# Patient Record
Sex: Female | Born: 1955 | Race: White | Hispanic: No | Marital: Married | Smoking: Never smoker
Health system: Southern US, Community
[De-identification: ages and names within clinical notes are randomized; demographics above are authoritative.]

## PROBLEM LIST (undated history)

## (undated) DIAGNOSIS — M1990 Unspecified osteoarthritis, unspecified site: Secondary | ICD-10-CM

## (undated) DIAGNOSIS — F419 Anxiety disorder, unspecified: Secondary | ICD-10-CM

## (undated) DIAGNOSIS — M75100 Unspecified rotator cuff tear or rupture of unspecified shoulder, not specified as traumatic: Secondary | ICD-10-CM

## (undated) DIAGNOSIS — K219 Gastro-esophageal reflux disease without esophagitis: Secondary | ICD-10-CM

## (undated) HISTORY — PX: KNEE ARTHROSCOPY: SUR90

## (undated) HISTORY — PX: TONSILLECTOMY: SUR1361

## (undated) HISTORY — PX: UPPER GASTROINTESTINAL ENDOSCOPY: SHX188

---

## 2001-01-14 HISTORY — PX: TOTAL ABDOMINAL HYSTERECTOMY: SHX209

## 2001-01-14 HISTORY — PX: ANTERIOR AND POSTERIOR REPAIR: SHX1172

## 2001-09-29 ENCOUNTER — Other Ambulatory Visit: Admission: RE | Admit: 2001-09-29 | Discharge: 2001-09-29 | Payer: Self-pay | Admitting: Obstetrics and Gynecology

## 2001-11-03 ENCOUNTER — Encounter (INDEPENDENT_AMBULATORY_CARE_PROVIDER_SITE_OTHER): Payer: Self-pay | Admitting: Specialist

## 2001-11-03 ENCOUNTER — Inpatient Hospital Stay (HOSPITAL_COMMUNITY): Admission: RE | Admit: 2001-11-03 | Discharge: 2001-11-06 | Payer: Self-pay | Admitting: Obstetrics and Gynecology

## 2004-05-07 ENCOUNTER — Ambulatory Visit (HOSPITAL_COMMUNITY): Admission: RE | Admit: 2004-05-07 | Discharge: 2004-05-07 | Payer: Self-pay | Admitting: Gastroenterology

## 2005-02-14 ENCOUNTER — Emergency Department (HOSPITAL_COMMUNITY): Admission: EM | Admit: 2005-02-14 | Discharge: 2005-02-14 | Payer: Self-pay | Admitting: Emergency Medicine

## 2005-04-30 ENCOUNTER — Ambulatory Visit (HOSPITAL_COMMUNITY): Admission: RE | Admit: 2005-04-30 | Discharge: 2005-04-30 | Payer: Self-pay | Admitting: Family Medicine

## 2007-01-15 HISTORY — PX: ORIF ANKLE FRACTURE: SUR919

## 2007-01-29 ENCOUNTER — Ambulatory Visit (HOSPITAL_COMMUNITY): Admission: RE | Admit: 2007-01-29 | Discharge: 2007-01-29 | Payer: Self-pay | Admitting: Obstetrics & Gynecology

## 2007-08-19 ENCOUNTER — Ambulatory Visit (HOSPITAL_COMMUNITY): Admission: RE | Admit: 2007-08-19 | Discharge: 2007-08-19 | Payer: Self-pay | Admitting: Gastroenterology

## 2007-11-11 ENCOUNTER — Emergency Department (HOSPITAL_COMMUNITY): Admission: EM | Admit: 2007-11-11 | Discharge: 2007-11-11 | Payer: Self-pay | Admitting: Emergency Medicine

## 2007-11-12 ENCOUNTER — Ambulatory Visit (HOSPITAL_BASED_OUTPATIENT_CLINIC_OR_DEPARTMENT_OTHER): Admission: RE | Admit: 2007-11-12 | Discharge: 2007-11-12 | Payer: Self-pay | Admitting: Orthopaedic Surgery

## 2008-01-15 HISTORY — PX: HARDWARE REMOVAL: SHX979

## 2008-08-17 ENCOUNTER — Emergency Department (HOSPITAL_COMMUNITY): Admission: EM | Admit: 2008-08-17 | Discharge: 2008-08-17 | Payer: Self-pay | Admitting: Emergency Medicine

## 2008-09-27 ENCOUNTER — Ambulatory Visit (HOSPITAL_BASED_OUTPATIENT_CLINIC_OR_DEPARTMENT_OTHER): Admission: RE | Admit: 2008-09-27 | Discharge: 2008-09-27 | Payer: Self-pay | Admitting: Orthopaedic Surgery

## 2009-02-27 ENCOUNTER — Ambulatory Visit (HOSPITAL_COMMUNITY): Admission: RE | Admit: 2009-02-27 | Discharge: 2009-02-27 | Payer: Self-pay | Admitting: Gastroenterology

## 2009-04-03 ENCOUNTER — Encounter: Admission: RE | Admit: 2009-04-03 | Discharge: 2009-04-03 | Payer: Self-pay | Admitting: Internal Medicine

## 2010-01-14 HISTORY — PX: ERCP: SHX60

## 2010-02-04 ENCOUNTER — Encounter: Payer: Self-pay | Admitting: Gastroenterology

## 2010-03-08 ENCOUNTER — Ambulatory Visit (HOSPITAL_COMMUNITY)
Admission: RE | Admit: 2010-03-08 | Discharge: 2010-03-08 | Disposition: A | Discharge status: Home / Self Care | Payer: 59 | Source: Ambulatory Visit | Attending: Gastroenterology | Admitting: Gastroenterology

## 2010-03-08 DIAGNOSIS — K222 Esophageal obstruction: Secondary | ICD-10-CM | POA: Insufficient documentation

## 2010-03-08 DIAGNOSIS — Z1211 Encounter for screening for malignant neoplasm of colon: Secondary | ICD-10-CM | POA: Insufficient documentation

## 2010-04-17 NOTE — Op Note (Signed)
  [name], Lacey               ACCOUNT NO.:  [id number]  MEDICAL RECORD NO.:  [id number]           PATIENT TYPE:  O  LOCATION:  WLEN                         FACILITY:  [facility]  PHYSICIAN:  Graylin Shiver, M.D.   DATE OF BIRTH:  1955-12-10  DATE OF PROCEDURE:  03/08/2010 DATE OF DISCHARGE:                              OPERATIVE REPORT   PROCEDURE:  Screening colonoscopy.  INDICATIONS FOR PROCEDURE:  Screening for colorectal cancer.  Informed consent was obtained after explanation of the risks of bleeding, infection, and perforation.  PREMEDICATION:  The procedure was done after an EGD with balloon dilatation.  There was an additional dose of fentanyl 75 mg IV given and Versed 3 mg given for this procedure.  DESCRIPTION OF PROCEDURE:  With the patient in the left lateral decubitus position, a rectal exam was performed.  No masses were felt. The Pentax colonoscope was inserted into the rectum and advanced around the colon to the cecum.  Cecal landmarks were identified.  The cecum and ascending colon were normal.  The transverse colon was normal.  The descending colon, sigmoid, and rectum were normal.  Retroflexion was normal.  She tolerated the procedure well without complications.  IMPRESSION:  Normal colonoscopy to the cecum.  Would recommend repeat screening colonoscopy in 10 years.          ______________________________ Graylin Shiver, M.D.     SFG/MEDQ  D:  03/08/2010  T:  03/09/2010  Job:  [id number]  Electronically Signed by Herbert Moors MD on 04/17/2010 04:38:12 PM

## 2010-04-17 NOTE — Op Note (Signed)
  Lacey [name], Lacey [name]               ACCOUNT NO.:  [id number]  MEDICAL RECORD NO.:  [id number]           PATIENT TYPE:  O  LOCATION:  WLEN                         FACILITY:  [facility]  PHYSICIAN:  Graylin Shiver, M.D.   DATE OF BIRTH:  02-26-1955  DATE OF PROCEDURE:  03/08/2010 DATE OF DISCHARGE:                              OPERATIVE REPORT   PROCEDURE:  Esophagogastroduodenoscopy with endoscopic balloon dilatation of the Schatzki ring.  INDICATIONS FOR PROCEDURE:  The patient is a 55 year old female who has had two meat impactions removed from her esophagus in the past, the first one done by Dr. Bosie Clos and the second one done by Dr. Madilyn Fireman. These occurred about a year and a half apart.  It was determined on both of those endoscopies that she had a distal esophageal ring and it was recommended to her to have this dilated.  She had never gotten around to doing that, but decided to have it done now because she was continuing to have problems with dysphagia.  Informed consent was obtained after explanation of the risks of bleeding, infection, and perforation.  PREMEDICATION:  Fentanyl 75 mcg IV, Versed 6 mg IV, and Benadryl 50 mg IV.  DESCRIPTION OF PROCEDURE:  With the patient in the left lateral decubitus position, a Pentax gastroscope was inserted into the oropharynx and passed into the esophagus.  It was advanced down the esophagus, then into the stomach, and into the duodenum.  The second portion and bulb of the duodenum were normal.  The stomach had normal- appearing mucosa in its entirety.  Retroflexion of the fundus and cardia looked normal.  The scope was then brought back to the esophagogastric junction and it seemed to be a little tight although I did not definitely appreciate a concentric ring, which had been seen on previous occasions.  In any event, because of the prior history and findings, and because of the two episodes of meat impaction, I went ahead and  advanced an endoscopic balloon dilator and placed it at the level of the esophagogastric junction.  The balloon was inflated to 15, then 16.5, then 18 mm, and held in place at each level for 1 minute.  I then reexamined the area and then re-dilated the area once again to 16.5 and 18 mm.  There was a little heme at the site of the dilation.  No other abnormalities were seen.  She tolerated the procedure well without complications.  IMPRESSION:  Schatzki' ring, which was not definitely appreciated on this endoscopic examination, but had been in the past.  This was dilated to 18 mm.  PLAN:  We will observe the response to the dilatation.          ______________________________ Graylin Shiver, M.D.    SFG/MEDQ  D:  03/08/2010  T:  03/09/2010  Job:  [id number]  Electronically Signed by Herbert Moors MD on 04/17/2010 04:38:09 PM

## 2010-04-20 LAB — POCT HEMOGLOBIN-HEMACUE: Hemoglobin: 13.2 g/dL (ref 12.0–15.0)

## 2010-05-29 NOTE — Op Note (Signed)
NAMECOURTENEY, ALDERETE               ACCOUNT NO.:  [id number]   MEDICAL RECORD NO.:  [id number]          PATIENT TYPE:  AMB   LOCATION:  ENDO                         FACILITY:  [facility]   PHYSICIAN:  Shirley Friar, MDDATE OF BIRTH:  Nov 22, 1955   DATE OF PROCEDURE:  DATE OF DISCHARGE:                               OPERATIVE REPORT   INDICATIONS:  Dysphagia.   HISTORY OF PRESENT ILLNESS:  Ms. Cammack is a 55 year old white female  who was an established patient of Dr. Evette Cristal who ate a piece of chicken  yesterday and felt like it hung up in her esophagus when she swallowed.  She started retching and felt like she threw some of it up yesterday.  Since that time, she has been unable to swallow any saliva and called to  have this further evaluated.  She was recommended to have upper  endoscopy with possible dilation in 2008, but she never followed through  on schedule.   MEDICATIONS:  1. Fentanyl 100 mcg IV.  2. Versed 9 mg IV.   FINDINGS:  Endoscope was inserted through the oropharynx and esophagus  was intubated.  The proximal and mid esophagus was normal in appearance  except for some pools of saliva seen.  In the distal esophagus, there  was a large food bolus that was obscuring the lumen of the esophagus.  During attempts to remove this food bolus with Lucina Mellow Net, the food bolus  advanced down into the stomach and the esophagus was then freed of all  food particles.  On careful evaluation of the esophagus, a distal  nonobstructing circumferential benign-appearing esophageal ring was  seen.  This esophageal ring was noted at the GE junction.  There was  some mild erythema of this area consistent with pressure irritation from  the food bolus.  Endoscope was easily advanced through the esophageal  ring into the stomach which revealed a normal stomach mucosa.  Retroflexion confirmed normal proximal stomach.  Endoscope was  straightened and advanced into the duodenal bulb and second  portion of  the duodenum which were both normal.  The endoscope was then withdrawn  back into the esophagus and the distal esophageal ring was reevaluated.  Due to trauma from the food bolus causing erythema and edema, decision  was made not to dilate at this time.   ASSESSMENT:  1. Distal nonobstructing esophageal ring (Schatzki's ring).  2. Food bolus cleared during endoscopy.   PLAN:  1. The patient needs to resume Prilosec 20 mg p.o. daily.  2. The patient is to have upper endoscopy in 3-4 weeks for esophageal      dilation.      Shirley Friar, MD  Electronically Signed     VCS/MEDQ  D:  08/19/2007  T:  08/19/2007  Job:  [id number]   cc:   Graylin Shiver, M.D.  Sigmund Hazel, M.D.
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
## 2010-06-01 NOTE — Discharge Summary (Signed)
NAME:  Lacey [name], Lacey [name]                         ACCOUNT NO.:  [id number]   MEDICAL RECORD NO.:  [id number]                   PATIENT TYPE:  INP   LOCATION:  0471                                 FACILITY:  [facility]   PHYSICIAN:  Carrington Clamp, M.D.              DATE OF BIRTH:  Aug 28, 1955   DATE OF ADMISSION:  11/03/2001  DATE OF DISCHARGE:  11/06/2001                                 DISCHARGE SUMMARY   ADMISSION DIAGNOSES:  1. Right ovarian complex cyst.  2. Menorrhagia.  3. Pelvic organ prolapse with cystocele and rectocele.  4. Stress urinary incontinence.   DISCHARGE DIAGNOSES:  1. Right ovarian complex cyst.  2. Menorrhagia.  3. Pelvic organ prolapse with cystocele and rectocele.  4. Stress urinary incontinence.   PROCEDURE:  1. Total abdominal hysterectomy.  2. Right salpingo-oophorectomy.  3. Charletta Cousin procedure.  4. Cystoscopy.  5. Posterior repair with culdoplasty.   LABORATORY DATA:  Postoperative hemoglobin and hematocrit 9 and 26.6.   PHYSICAL EXAMINATION:  Please refer to the dictated history and physical.   HISTORY OF PRESENT ILLNESS:  Briefly, this is a 55 year old Gravida II, Para  II 0/0/2, complaining of pain and pressure, now with a 3-4 cm cyst on her  ovary and having stress urinary incontinence and symptoms of pelvic organ  prolapse.   HOSPITAL COURSE:  The patient was admitted on October 21. 2003 for the above  named procedures. She underwent them without complications. On November 04, 2001, postoperative day one, the patient was having difficulty with pain  control. On October 23. 2003 her hemoglobin appeared to drop to 7.9 with  hematocrit 23.4. However, repeat hemoglobin and hematocrit was 9 and 26.6.  She also had some significant bruising on her abdomen that was slowly  evolving. However, there was no evidence of bleeding on pelvic examination  either. The patient on November 05, 2001 underwent a voiding trial. However,  she was unable to void and  so the catheter was placed back in overnight. On  November 06, 2001 she underwent a voiding trial again but failed it and went  home with the following.   DISCHARGE MEDICATIONS:  1. Cefotan 250 mg twice a day times three days.  2. Percocet 5 mg one by mouth every three hour as needed pain.  3. Motrin as needed.  4. Colace 100 mg one by mouth twice a day times six weeks.   SPECIAL INSTRUCTIONS:  The patient was discharged with Foley and leg bag.   ACTIVITY:  No straining times six weeks, no heavy lifting times six weeks,  pelvic rest times six weeks and no driving times two weeks.   DIET:  High fiber diet.   FOLLOW UP:  The following Monday for voiding trial in the office.  Carrington Clamp, M.D.    MH/MEDQ  D:  12/08/2001  T:  12/08/2001  Job:  [id number]

## 2010-06-01 NOTE — Op Note (Signed)
NAME:  Lacey [name], Lacey [name]                         ACCOUNT NO.:  [id number]   MEDICAL RECORD NO.:  [id number]                   PATIENT TYPE:  INP   LOCATION:  0002                                 FACILITY:  [facility]   PHYSICIAN:  Carrington Clamp, M.D.              DATE OF BIRTH:  22-Mar-1955   DATE OF PROCEDURE:  11/03/2001  DATE OF DISCHARGE:                                 OPERATIVE REPORT   PREOPERATIVE DIAGNOSES:  1. Complex right ovarian cyst.  2. Menorrhagia.  3. Pelvic organ prolapse with cystocele and rectocele.  4. Stress urinary incontinence.   POSTOPERATIVE DIAGNOSES:  1. Complex right ovarian cyst.  2. Menorrhagia.  3. Pelvic organ prolapse with cystocele and rectocele.  4. Stress urinary incontinence.   PROCEDURES:  1. Total abdominal hysterectomy.  2. Right salpingo-oophorectomy.  3. Burch procedure.  4. Cystoscopy.  5. Posterior repair.  6. Culdoplasty.   SURGEON:  Carrington Clamp, M.D.   ASSISTANT:  Luvenia Redden, M.D.   ANESTHESIA:  General endotracheal anesthesia.   ESTIMATED BLOOD LOSS:  250 cc.   INTRAVENOUS FLUIDS:  3600 cc.   URINE OUTPUT:  400 cc.   COMPLICATIONS:  None.   FINDINGS:  Right ovary with approximately 3 cm cyst but, otherwise, normal  in appearance.  The left ovary was normal with some adhesions to the bowel.  The uterus had multiple small fibroids but, otherwise, was approximately  seven weeks in size.  The cystocele was successfully reduced with the Burch  and; therefore, an anterior repair was not needed to be done.  Rectocele was  to the 0 position of the hymenal ring.  There was good efflux of indigo  carmine from the bilateral ureteral orifices off and on tension.  There were  no stitches in the bladder.   MEDICATIONS:  Indigo carmine.   PATHOLOGY:  Right ovary which was sent to pathology during the case.  The  pathologist called and indicated that it was benign in general appearance  and a frozen section was not done.  The uterus, cervix and right tube were  also sent to pathology.   COUNTS:  Correct x3.   DESCRIPTION OF PROCEDURE:  After adequate general anesthesia was achieved,  the patient was prepped and draped in the usual sterile fashion in the  dorsal lithotomy position.  The bladder was catheterized with the Foley  catheter and a Pfannenstiel skin incision was then made in the abdomen  approximately 1 cm above the pubic symphysis.  This was carried down to the  fascia with the Bovie cautery, and then the fascia was incised in the  midline with the scalpel and carried in a transverse curvilinear mode with  Mayo scissors.   The fascia was reflected superiorly and inferiorly from the rectus muscles  and the rectus muscles split in the midline.  The free portion of the  peritoneum was then dissected carefully with Metzenbaum  scissors and then  incised in a superior and inferior manner with good visualization of the  bowel and the bladder.   The Balfour retractor was placed and the bowel was packed away with four wet  laps.  A small amount of adhesions of the bowel to the left ovary were taken  down with Metzenbaum scissors.  A pair of Kellys were used to grasp the  uterus at the cornua.   Attention was then turned the ovary.  On the right-hand side, the round  ligament was secured with a suture of transfixion stitch of #0 Vicryl and  divided with the Bovie cautery.  The Bovie was then used to incise the  vesicouterine fascia and also the posterior leaf of the broad ligament in  order to enter into it.  The infundibulopelvic ligament on the right-hand  side was secured with a Haney clamp and then divided with the Mayo scissors.  The pedicle was then secured with a free hand tie of a stitch of #0 Vicryl.  The uterine arteries were skeletonized on the right-hand side and the  bladder taken down with sharp and blunt dissection.   Attention was then turned to the left-hand side where the round  ligament was  secured in the same manner.  The posterior peritoneum was entered into with  the Bovie cautery, and the ovarian ligament was secured with a Haney stitch.  The ovary was left in on this side per the patient's request.  The pedicle  was secured with a free hand tie of #0 Vicryl followed by a stitch of #0  Vicryl.   The uterine arteries were skeletonized on the left-hand side and Haneys were  placed at the level of the internal os of the cervix.  Each pedicle was  divided with Mayo scissors and secured with a stitch of #0 Vicryl.  The  cardinal ligament was then divided in alternating successive bites with the  [facility] clamp.  Each pedicle was divided with the scalpel and secured with  a stitch of #0 Vicryl.  At the level of the uterosacrals and the reflection  of the vagina onto the cervix, a pair of Haney were placed.  The Mayo  scissors were used to incise each pedicle and each pedicle was secured with  a Haney stitch of #0 Vicryl.  The uterine and cervix specimen were then  amputated with the Jorgenson scissors and handed to pathology.   The cuff was closed with multiple figure-of-eight stitches, the angles of  which being attached to the uterosacrals for support.  Hemostasis was  achieved, and the pelvis was irrigated with sterile water.   Because of the depth of the cul-de-sac and the risks of enterocele with a  Burch procedure, a culdoplasty was undertaken with three 3-0 silk sutures  which were passed through the descending, sigmoid, peritoneum, cul-de-sac  and the posterior peritoneum of the vagina.  The lateral stitches were also  passed through the uterosacrals.  These were tied down.   All instruments were then withdrawn from the abdomen and the peritoneum was  closed with a running stitch of 2-0 Vicryl.  The Balfour retractor was replaced with the blades underneath the rectus muscles and the space of  Retzius dissected bluntly.  The back of a DeBakey was used  to clear the fat  off of the vesicovaginal fascia just inferior and lateral to the bladder  neck bilaterally.  Bilateral stitches were placed, one medial and one  lateral in  this area of #1 Prolene.  Attention was then turned to cystoscopy  which revealed good efflux of indigo carmine from bilateral ureteral  orifices both off and on tension with the stitches.  There were no stitches  located in the bladder as well.   After changing gloves, attention was then turned to the abdomen where each  of the stitches was placed through its respective Cooper's ligament.  These  were tied down medial first and then lateral stitches for the appropriate  amount of tension to provide patient continence.  The Foley had been  replaced after the cystoscopy had been performed.   The space of Retzius was irrigated and found to be hemostatic, and the  fascia was then closed with a running stitch of #0 PDS.  The subcutaneous  tissue was rendered hemostatic with Bovie cautery, and then the skin was  closed with staples.   Attention was turned to the vagina of which the posterior fourchette was  grasped with a pair of Allis clamps and an inverted triangular incision was  made on the perineum with the scalpel.  This triangular piece of skin was  removed and then the dissection of the vaginal mucosa off the rectovaginal  fascia was undertaken with sharp and blunt dissection with  the Metzenbaums  in the midline.  With the aid of multiple Allis clamps, the dissection was  carried laterally with sharp and blunt dissection with the Metzenbaum  scissors as well.  Three mattress sutures were placed in the lateral  rectovaginal fascia bilaterally and tied down to close the space.  The  rectovaginal mucosa was then trimmed and the vaginal mucosa closed with a  running locked stitch of 2-0 Vicryl in the same manner as an episiotomy.  The cystocele was adequately reduced with the Burch procedure and the cuff  was also  suspended so the operation was then terminated.   The patient tolerated the procedure well and was returned to the recovery  room in stable condition.                                               Carrington Clamp, M.D.    MH/MEDQ  D:  11/03/2001  T:  11/03/2001  Job:  [id number]

## 2010-06-01 NOTE — H&P (Signed)
NAME:  Lacey, [name]                         ACCOUNT NO.:  [id number]   MEDICAL RECORD NO.:  [id number]                   PATIENT TYPE:  INP   LOCATION:  NA                                   FACILITY:  [facility]   PHYSICIAN:  Carrington Clamp, M.D.              DATE OF BIRTH:  1955/06/08   DATE OF ADMISSION:  DATE OF DISCHARGE:                                HISTORY & PHYSICAL   CHIEF COMPLAINT:  This is a 55 year old, G2, P2-0-0-2, complaining of pain  and pressure, now with a 3-4 cm cyst on her ovary.   HISTORY OF PRESENT ILLNESS:  The patient was referred to me after having an  ultrasound at [facility] which showed a 5 cm, thin-walled cyst  with some septations and debris.  The patient had been complaining of  pressure.  She had stated her periods about a year ago had been normal and  every 28 days but had become more irregular every 24-32 days.  She had  actually skipped her period in August.  She has no intramenstrual bleeding  but some postcoital bleeding that had been investigated in 1994, with an  endometrial biopsy that had been negative.  She complained of some  discomfort with sexual intercourse.   She also complained of having to use laxatives and splinting in order to  have bowel movements because of a significant rectocele.  She has urge  symptoms and leaks but also has occasional stress urinary incontinence with  increased activity.  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   PAST MEDICAL HISTORY:  Negative for diabetes, high blood pressure, heart  disease, or thyroid problems.   PAST SURGICAL HISTORY:  In 1977, she had a pelvic infection followed by a  laparoscopy in 1979 for extensive adhesions.   PAST OBSTETRICAL HISTORY:  Term spontaneous vaginal delivery x 2.   FAMILY HISTORY:  Negative for breast or female  cancers.   SOCIAL HISTORY:  The patient is not a smoker.   MEDICATIONS:  She is on no medications currently.   ALLERGIES:  She is possibly allergic to MORPHINE.   PHYSICAL EXAMINATION:  HEENT:  Anicteric, without lymphadenopathy.  HEART:  Regular rate and rhythm.  LUNGS:  Clear to auscultation bilaterally.  BREASTS:  Without masses.  ABDOMEN:  Soft, nontender, nondistended.  EXTREMITIES:  Benign.  PELVIC:  Normal external genitalia.  Vaginal exam revealed a rectocele to  the 0 position of the hymenal ring and a cystocele to the -1 position above  the hymenal ring.  A Q-tip test showed about 90 degrees of deflection of the  urethra on Valsalva.  There was no spontaneous leak, however.  Her uterus is  approximately eight weeks in size and retroverted.  There appeared at the  top of the cuff  during rectovaginal exam a mobile 3-4 cm tender mass in the  back of the cuff on the left.  Hemoccult was negative.  The cervix did have  some descensus to it.   LABORATORY DATA:  The patient had a Pap smear that was negative.  A follow-  up ultrasound showed a small fibroid in the ventral wall of the uterus.  The  left ovary had a simple cyst measuring 3.1 x 2.2.  The right ovary was 3.8 x  4.8.  The cyst within the left ovary measured about 3 cm and was simple and  benign in appearance.  The hypoechoic process within the right ovary  measured approximately 6.4 x 2 cm in maximal dimension.  It may actually  include two separate cysts with a curvilinear septation and echogenic  material still seen within the cyst.  Although the cyst does not have any  typical cancerous appearance, it is still significant and needs to be  examined.   A CA125 was performed which was 14, which is normal.   PLAN:  All risks, benefits, and alternatives of surgery were discussed with  the patient.  She is for a total abdominal hysterectomy with right salpingo-  oophorectomy, possible BSO, depending on the status of  the left ovary.  I  elected to do an open procedure because of a history of infection in the  patient and adhesions diagnosed on the laparoscopy which would make a  vaginal hysterectomy much more difficult.  Because of the possibility that  this may be also a cancerous process, I feel the need to take the ovary out  directly rather than trying to get it out from below or via a laparoscopic-  assisted procedure.  The patient also has been consented for a Burch  procedure with anterior and posterior repair based on the risks of stress  urinary incontinence developing shortly after surgery because of her extreme  urethral hypermobility.  The patient understands that there is a risk of  retention in this procedure despite the fact that she has no occult stress  urinary incontinence at this time, probably secondary to the large  cystocele.  The patient understands and accepts these risks in order to save  herself from having to go to the operating room in the future to fix the  stress urinary incontinence and urethral hypermobility.  The patient will  receive preoperative antibiotics and sequential compression devices in the  OR.  All risks, benefits, and alternatives were discussed with the patient.  The patient understands and agrees and will undergo a TAH, RSO, possible BSO  with Burch procedure, cystoscopy, anterior and posterior repair tomorrow.                                               Carrington Clamp, M.D.    MH/MEDQ  D:  11/02/2001  T:  11/02/2001  Job:  [id number]

## 2010-09-24 ENCOUNTER — Other Ambulatory Visit (HOSPITAL_COMMUNITY): Payer: Self-pay | Admitting: Family Medicine

## 2010-09-24 DIAGNOSIS — Z1231 Encounter for screening mammogram for malignant neoplasm of breast: Secondary | ICD-10-CM

## 2010-10-03 ENCOUNTER — Ambulatory Visit (HOSPITAL_COMMUNITY): Payer: 59

## 2010-10-10 ENCOUNTER — Ambulatory Visit (HOSPITAL_COMMUNITY): Payer: 59

## 2010-10-10 ENCOUNTER — Ambulatory Visit (HOSPITAL_COMMUNITY)
Admission: RE | Admit: 2010-10-10 | Discharge: 2010-10-10 | Disposition: A | Discharge status: Home / Self Care | Payer: 59 | Source: Ambulatory Visit | Attending: Family Medicine | Admitting: Family Medicine

## 2010-10-10 DIAGNOSIS — Z1231 Encounter for screening mammogram for malignant neoplasm of breast: Secondary | ICD-10-CM | POA: Insufficient documentation

## 2010-10-15 LAB — DIFFERENTIAL
Basophils Absolute: 0.1
Basophils Relative: 1
Monocytes Absolute: 0.5
Neutro Abs: 4.6

## 2010-10-15 LAB — POCT I-STAT, CHEM 8
BUN: 18
Calcium, Ion: 1.19
Chloride: 105
Creatinine, Ser: 1
Hemoglobin: 11.9 — ABNORMAL LOW
Sodium: 141
TCO2: 26

## 2010-10-15 LAB — CBC
MCHC: 33.5
Platelets: 336
RDW: 14

## 2010-10-15 LAB — URINALYSIS, ROUTINE W REFLEX MICROSCOPIC
Bilirubin Urine: NEGATIVE
Hgb urine dipstick: NEGATIVE
Ketones, ur: NEGATIVE
Nitrite: NEGATIVE
Urobilinogen, UA: 0.2

## 2010-10-15 LAB — SAMPLE TO BLOOD BANK

## 2011-05-24 ENCOUNTER — Encounter (HOSPITAL_COMMUNITY): Payer: Self-pay | Admitting: *Deleted

## 2011-05-24 ENCOUNTER — Emergency Department (INDEPENDENT_AMBULATORY_CARE_PROVIDER_SITE_OTHER)
Admission: EM | Admit: 2011-05-24 | Discharge: 2011-05-24 | Disposition: A | Discharge status: Home / Self Care | Payer: 59 | Source: Home / Self Care | Attending: Emergency Medicine | Admitting: Emergency Medicine

## 2011-05-24 DIAGNOSIS — L255 Unspecified contact dermatitis due to plants, except food: Secondary | ICD-10-CM

## 2011-05-24 DIAGNOSIS — L237 Allergic contact dermatitis due to plants, except food: Secondary | ICD-10-CM

## 2011-05-24 MED ORDER — HYDROXYZINE HCL 25 MG PO TABS: 25.0000 mg | ORAL_TABLET | Freq: Four times a day (QID) | ORAL | Status: AC

## 2011-05-24 MED ORDER — PREDNISONE 20 MG PO TABS: 40.0000 mg | ORAL_TABLET | Freq: Every day | ORAL | Status: AC

## 2011-05-24 MED ORDER — TRIAMCINOLONE ACETONIDE 0.1 % EX CREA: TOPICAL_CREAM | Freq: Two times a day (BID) | CUTANEOUS | Status: DC

## 2011-05-24 NOTE — ED Provider Notes (Addendum)
History     CSN: [id number]  Arrival date & time 05/24/11  0920   First MD Initiated Contact with Patient 05/24/11 0920      Chief Complaint  Patient presents with  . 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 Has several patches on her upper extremities and abdomen. Have already been taking a course of 2 weeks of prednisone about 2 weeks ago. With partial relief but still has some patches that are bothering her mainly on her upper extremities with mild itching has been applying Caladryl, "can you give me a shot", still bother me and itching    Patient is a 56 y.o. female presenting with rash. The history is provided by the patient.  Rash  This is a new problem. The problem has not changed since onset.There has been no fever. Associated symptoms include itching. Pertinent negatives include no pain and no weeping. The treatment provided no relief.    History reviewed. No pertinent past medical history.  History reviewed. No pertinent past surgical history.  No family history on file.  History  Substance Use Topics  . Smoking status: Not on file  . Smokeless tobacco: Not on file  . Alcohol Use: Not on file    OB History    Grav Para Term Preterm Abortions TAB SAB Ect Mult Living                  Review of Systems  Constitutional: Negative for fever, chills, appetite change and fatigue.  Respiratory: Negative for cough and shortness of breath.   Cardiovascular: Negative for chest pain.  Skin: Positive for itching and rash. Negative for color change, pallor and wound.    Allergies  Review of patient's allergies indicates no known allergies.  Home Medications   Current Outpatient Rx  Name Route Sig Dispense Refill  . DULOXETINE HCL 20 MG PO CPEP Oral Take 20 mg by mouth daily.      Marland Kitchen HYDROXYZINE HCL 25 MG PO TABS Oral Take 1 tablet (25 mg total) by mouth every 6 (six) hours. 12 tablet 0  . PREDNISONE 20 MG PO TABS Oral Take 2 tablets (40 mg total) by mouth daily. 2 tablets daily for 5 days 10 tablet 0  . TRIAMCINOLONE ACETONIDE 0.1 % EX CREA Topical Apply topically 2 (two) times daily. 30 g 0    BP 123/78  Pulse 84  Temp(Src) 98.1 F (36.7 C) (Oral)  Resp 16  SpO2 99%  Physical Exam  Nursing note and vitals reviewed. Constitutional: She appears well-developed and well-nourished.  Eyes: Conjunctivae are normal.  Musculoskeletal: Normal range of motion.  Neurological: She is alert.  Skin: Rash noted. There is erythema.       ED Course  Procedures (including critical care time)  Labs Reviewed - No data to display No results found.   1. 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 Has several patches on her upper extremities and abdomen. Have already been taking a course of 2 weeks of prednisone about 2 weeks ago. With partial relief but still has some patches that are bothering her mainly on her upper extremities with mild itching has been applying Caladryl, "can you give me a shot", still bother me and itching  Jimmie Molly, MD 05/24/11 1300  Jimmie Molly, MD 05/24/11 [phone]

## 2011-05-24 NOTE — ED Notes (Signed)
Rash    X   1  Month          Pt  Reports   She  Has  Been  Taking  otc  Caladryl     For  The  Symptoms        She  Reports     She  Thinks         It  May  Be  Poison  [place] /  [place]         She  Displays  No  Angio  Edema  Or  Any  resp  Distress

## 2011-05-24 NOTE — Discharge Instructions (Signed)
Poison Ivy Poison ivy is a rash caused by touching the leaves of the poison ivy plant. The rash often shows up 48 hours later. You might just have bumps, redness, and itching. Sometimes, blisters appear and break open. Your eyes may get puffy (swollen). Poison ivy often heals in 2 to 3 weeks without treatment. HOME CARE  If you touch poison ivy:   Wash your skin with soap and water right away. Wash under your fingernails. Do not rub the skin very hard.   Wash any clothes you were wearing.   Avoid poison ivy in the future. Poison ivy has 3 leaves on a stem.   Use medicine to help with itching as told by your doctor. Do not drive when you take this medicine.   Keep open sores dry, clean, and covered with a bandage and medicated cream, if needed.   Ask your doctor about medicine for children.  GET HELP RIGHT AWAY IF:  You have open sores.   Redness spreads beyond the area of the rash.   There is yellowish white fluid (pus) coming from the rash.   Pain gets worse.   You have a temperature by mouth above 102 F (38.9 C), not controlled by medicine.  MAKE SURE YOU:  Understand these instructions.   Will watch your condition.   Will get help right away if you are not doing well or get worse.  Document Released: 02/02/2010 Document Revised: 12/20/2010 Document Reviewed: 02/02/2010 [employer] Patient Information 2012 [employer]. 

## 2011-09-05 ENCOUNTER — Ambulatory Visit (HOSPITAL_COMMUNITY)
Admission: RE | Admit: 2011-09-05 | Discharge: 2011-09-05 | Disposition: A | Discharge status: Home / Self Care | Payer: 59 | Source: Ambulatory Visit | Attending: Orthopaedic Surgery | Admitting: Orthopaedic Surgery

## 2011-09-05 ENCOUNTER — Other Ambulatory Visit (HOSPITAL_COMMUNITY): Payer: Self-pay | Admitting: Orthopaedic Surgery

## 2011-09-05 DIAGNOSIS — IMO0002 Reserved for concepts with insufficient information to code with codable children: Secondary | ICD-10-CM | POA: Insufficient documentation

## 2011-09-05 DIAGNOSIS — M25519 Pain in unspecified shoulder: Secondary | ICD-10-CM | POA: Insufficient documentation

## 2011-09-05 DIAGNOSIS — M75100 Unspecified rotator cuff tear or rupture of unspecified shoulder, not specified as traumatic: Secondary | ICD-10-CM

## 2011-09-05 DIAGNOSIS — M67919 Unspecified disorder of synovium and tendon, unspecified shoulder: Secondary | ICD-10-CM | POA: Insufficient documentation

## 2011-09-05 DIAGNOSIS — M719 Bursopathy, unspecified: Secondary | ICD-10-CM | POA: Insufficient documentation

## 2011-09-10 ENCOUNTER — Other Ambulatory Visit: Payer: Self-pay | Admitting: Orthopaedic Surgery

## 2011-10-11 ENCOUNTER — Ambulatory Visit: Payer: Self-pay

## 2011-10-11 ENCOUNTER — Other Ambulatory Visit: Payer: Self-pay | Admitting: Occupational Medicine

## 2011-10-11 DIAGNOSIS — R52 Pain, unspecified: Secondary | ICD-10-CM

## 2011-10-15 ENCOUNTER — Encounter (HOSPITAL_BASED_OUTPATIENT_CLINIC_OR_DEPARTMENT_OTHER): Admission: RE | Payer: Self-pay | Source: Ambulatory Visit

## 2011-10-15 ENCOUNTER — Ambulatory Visit (HOSPITAL_BASED_OUTPATIENT_CLINIC_OR_DEPARTMENT_OTHER): Admission: RE | Admit: 2011-10-15 | Payer: 59 | Source: Ambulatory Visit | Admitting: Orthopaedic Surgery

## 2011-10-15 SURGERY — ARTHROSCOPY, SHOULDER
Anesthesia: General | Laterality: Right

## 2011-10-22 ENCOUNTER — Other Ambulatory Visit (HOSPITAL_COMMUNITY): Payer: Self-pay | Admitting: Orthopaedic Surgery

## 2011-10-22 DIAGNOSIS — M25511 Pain in right shoulder: Secondary | ICD-10-CM

## 2011-10-23 ENCOUNTER — Ambulatory Visit (HOSPITAL_COMMUNITY)
Admission: RE | Admit: 2011-10-23 | Discharge: 2011-10-23 | Disposition: A | Discharge status: Home / Self Care | Payer: PRIVATE HEALTH INSURANCE | Source: Ambulatory Visit | Attending: Orthopaedic Surgery | Admitting: Orthopaedic Surgery

## 2011-10-23 DIAGNOSIS — M25511 Pain in right shoulder: Secondary | ICD-10-CM

## 2011-10-23 DIAGNOSIS — S46819A Strain of other muscles, fascia and tendons at shoulder and upper arm level, unspecified arm, initial encounter: Secondary | ICD-10-CM | POA: Insufficient documentation

## 2011-10-23 DIAGNOSIS — W19XXXA Unspecified fall, initial encounter: Secondary | ICD-10-CM | POA: Insufficient documentation

## 2011-10-23 DIAGNOSIS — M19019 Primary osteoarthritis, unspecified shoulder: Secondary | ICD-10-CM | POA: Insufficient documentation

## 2011-10-30 ENCOUNTER — Other Ambulatory Visit: Payer: Self-pay | Admitting: Orthopaedic Surgery

## 2011-10-31 ENCOUNTER — Encounter (HOSPITAL_BASED_OUTPATIENT_CLINIC_OR_DEPARTMENT_OTHER): Payer: Self-pay | Admitting: *Deleted

## 2011-10-31 NOTE — Progress Notes (Signed)
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

## 2011-10-31 NOTE — H&P (Signed)
Lacey [name] is an 56 y.o. female.   Chief Complaint: right shoulder pain HPI: Lacey [name] is a patient well known to our practice who had had a recent injury to her right shoulder.  This point she is taking pain medication and Celebrex both and is to work due to this injury.  She is having pain with any attempts of motion of her shoulder and is using a sling.  She localizes her pain anteriorly and worse with increased activity.  She describes it as a throbbing aching sensation that is constant.  Recent MRI scan dated 10/23/11 shows a near complete subscapularis tendon tear.  There is also a longitudinal split in the biceps tendon with subluxation as well.  We have discussed with her proceeding with a shoulder operation to repair these 2 issues.  No past medical history on file.  No past surgical history on file.  No family history on file. Social History:  does not have a smoking history on file. She does not have any smokeless tobacco history on file. Her alcohol and drug histories not on file.  Allergies: No Known Allergies  No prescriptions prior to admission    No results found for this or any previous visit (from the past 48 hour(s)). No results found.  Review of Systems  Constitutional: Negative.   HENT: Negative.   Eyes: Negative.   Respiratory: Negative.   Cardiovascular: Negative.   Gastrointestinal: Negative.   Genitourinary: Negative.   Musculoskeletal: Negative.   Skin: Negative.   Neurological: Negative.   Endo/Heme/Allergies: Negative.   Psychiatric/Behavioral: Negative.     There were no vitals taken for this visit. Physical Exam  Constitutional: She appears well-nourished.  HENT:  Head: Atraumatic.  Eyes: EOM are normal.  Neck: Neck supple.  Cardiovascular: Regular rhythm.   Respiratory: Breath sounds normal.  GI: Bowel sounds are normal.  Musculoskeletal:       Right shoulder exam: Prominence and pain at the a.c. Joint.  Weakness of external and internal  rotation.  Impingement in 2 positions.  Pain with palpation on the anterior aspect of her shoulder.  Very limited motion due to pain.  Neurological: She is alert.  Skin: Skin is warm.  Psychiatric: She has a normal mood and affect.     Assessment/Plan Assessment: Right shoulder subscapularis tear, biceps tear and a.c. Degeneration Plan: We have discussed with her proceeding with a right shoulder operation and the risks of anesthesia, infection and neurovascular injury associated with an open procedure.  We have also discussed the need for extensive postoperative physical therapy and  The potential for being out of work for 3 months plus.  Lacey [name] 10/31/2011, 9:59 AM

## 2011-11-05 ENCOUNTER — Encounter (HOSPITAL_BASED_OUTPATIENT_CLINIC_OR_DEPARTMENT_OTHER): Payer: Self-pay | Admitting: *Deleted

## 2011-11-05 ENCOUNTER — Encounter (HOSPITAL_BASED_OUTPATIENT_CLINIC_OR_DEPARTMENT_OTHER): Payer: Self-pay | Admitting: Certified Registered Nurse Anesthetist

## 2011-11-05 ENCOUNTER — Encounter (HOSPITAL_BASED_OUTPATIENT_CLINIC_OR_DEPARTMENT_OTHER)
Admission: RE | Disposition: A | Discharge status: Home / Self Care | Payer: Self-pay | Source: Ambulatory Visit | Attending: Orthopaedic Surgery

## 2011-11-05 ENCOUNTER — Ambulatory Visit (HOSPITAL_BASED_OUTPATIENT_CLINIC_OR_DEPARTMENT_OTHER): Payer: PRIVATE HEALTH INSURANCE | Admitting: Certified Registered Nurse Anesthetist

## 2011-11-05 ENCOUNTER — Ambulatory Visit (HOSPITAL_BASED_OUTPATIENT_CLINIC_OR_DEPARTMENT_OTHER)
Admission: RE | Admit: 2011-11-05 | Discharge: 2011-11-06 | Disposition: A | Discharge status: Home / Self Care | Payer: PRIVATE HEALTH INSURANCE | Source: Ambulatory Visit | Attending: Orthopaedic Surgery | Admitting: Orthopaedic Surgery

## 2011-11-05 DIAGNOSIS — W19XXXA Unspecified fall, initial encounter: Secondary | ICD-10-CM | POA: Insufficient documentation

## 2011-11-05 DIAGNOSIS — S46819A Strain of other muscles, fascia and tendons at shoulder and upper arm level, unspecified arm, initial encounter: Secondary | ICD-10-CM

## 2011-11-05 DIAGNOSIS — D1600 Benign neoplasm of scapula and long bones of unspecified upper limb: Secondary | ICD-10-CM | POA: Insufficient documentation

## 2011-11-05 DIAGNOSIS — M19019 Primary osteoarthritis, unspecified shoulder: Secondary | ICD-10-CM | POA: Insufficient documentation

## 2011-11-05 DIAGNOSIS — S4380XA Sprain of other specified parts of unspecified shoulder girdle, initial encounter: Secondary | ICD-10-CM

## 2011-11-05 DIAGNOSIS — M66329 Spontaneous rupture of flexor tendons, unspecified upper arm: Secondary | ICD-10-CM | POA: Insufficient documentation

## 2011-11-05 DIAGNOSIS — S43499A Other sprain of unspecified shoulder joint, initial encounter: Secondary | ICD-10-CM | POA: Insufficient documentation

## 2011-11-05 HISTORY — PX: BICEPT TENODESIS: SHX5116

## 2011-11-05 HISTORY — PX: SHOULDER OPEN ROTATOR CUFF REPAIR: SHX2407

## 2011-11-05 HISTORY — DX: Gastro-esophageal reflux disease without esophagitis: K21.9

## 2011-11-05 LAB — POCT HEMOGLOBIN-HEMACUE: Hemoglobin: 12.7 g/dL (ref 12.0–15.0)

## 2011-11-05 SURGERY — REPAIR, ROTATOR CUFF, OPEN
Anesthesia: General | Site: Shoulder | Laterality: Right | Wound class: Clean

## 2011-11-05 MED ORDER — LIDOCAINE HCL 4 % MT SOLN
OROMUCOSAL | Status: DC | PRN
  Administered 2011-11-05: 2 mL via TOPICAL

## 2011-11-05 MED ORDER — LACTATED RINGERS IV SOLN
INTRAVENOUS | Status: DC
  Administered 2011-11-05: 11:00:00 via INTRAVENOUS

## 2011-11-05 MED ORDER — CEFAZOLIN SODIUM-DEXTROSE 2-3 GM-% IV SOLR
2.0000 g | INTRAVENOUS | Status: AC
  Administered 2011-11-05: 2 g via INTRAVENOUS

## 2011-11-05 MED ORDER — HYDROMORPHONE HCL PF 1 MG/ML IJ SOLN
0.2500 mg | INTRAMUSCULAR | Status: DC | PRN
  Administered 2011-11-05 (×3): 0.5 mg via INTRAVENOUS

## 2011-11-05 MED ORDER — PANTOPRAZOLE SODIUM 40 MG PO TBEC: 40.0000 mg | DELAYED_RELEASE_TABLET | Freq: Every day | ORAL | Status: DC

## 2011-11-05 MED ORDER — CHLORHEXIDINE GLUCONATE 4 % EX LIQD: 60.0000 mL | Freq: Once | CUTANEOUS | Status: DC

## 2011-11-05 MED ORDER — METOCLOPRAMIDE HCL 5 MG PO TABS: 5.0000 mg | ORAL_TABLET | Freq: Three times a day (TID) | ORAL | Status: DC | PRN

## 2011-11-05 MED ORDER — CELECOXIB 200 MG PO CAPS
200.0000 mg | ORAL_CAPSULE | Freq: Two times a day (BID) | ORAL | Status: DC
  Administered 2011-11-05: 200 mg via ORAL

## 2011-11-05 MED ORDER — LACTATED RINGERS IV SOLN
INTRAVENOUS | Status: DC
  Administered 2011-11-05 (×2): via INTRAVENOUS

## 2011-11-05 MED ORDER — MIDAZOLAM HCL 5 MG/5ML IJ SOLN
INTRAMUSCULAR | Status: DC | PRN
  Administered 2011-11-05: 1 mg via INTRAVENOUS

## 2011-11-05 MED ORDER — SUCCINYLCHOLINE CHLORIDE 20 MG/ML IJ SOLN
INTRAMUSCULAR | Status: DC | PRN
  Administered 2011-11-05: 100 mg via INTRAVENOUS

## 2011-11-05 MED ORDER — METHOCARBAMOL 100 MG/ML IJ SOLN: 500.0000 mg | Freq: Four times a day (QID) | INTRAVENOUS | Status: DC | PRN

## 2011-11-05 MED ORDER — OXYCODONE-ACETAMINOPHEN 5-325 MG PO TABS: ORAL_TABLET | ORAL | Status: DC

## 2011-11-05 MED ORDER — FENTANYL CITRATE 0.05 MG/ML IJ SOLN
50.0000 ug | INTRAMUSCULAR | Status: DC | PRN
  Administered 2011-11-05: 100 ug via INTRAVENOUS

## 2011-11-05 MED ORDER — LACTATED RINGERS IV SOLN: INTRAVENOUS | Status: DC

## 2011-11-05 MED ORDER — ONDANSETRON HCL 4 MG/2ML IJ SOLN: 4.0000 mg | Freq: Four times a day (QID) | INTRAMUSCULAR | Status: DC | PRN

## 2011-11-05 MED ORDER — DEXAMETHASONE SODIUM PHOSPHATE 4 MG/ML IJ SOLN
INTRAMUSCULAR | Status: DC | PRN
  Administered 2011-11-05: 10 mg via INTRAVENOUS

## 2011-11-05 MED ORDER — MIDAZOLAM HCL 2 MG/2ML IJ SOLN
1.0000 mg | INTRAMUSCULAR | Status: DC | PRN
  Administered 2011-11-05: 2 mg via INTRAVENOUS

## 2011-11-05 MED ORDER — DEXAMETHASONE SODIUM PHOSPHATE 4 MG/ML IJ SOLN
INTRAMUSCULAR | Status: DC | PRN
  Administered 2011-11-05: 10 mg

## 2011-11-05 MED ORDER — OXYCODONE HCL 5 MG/5ML PO SOLN: 5.0000 mg | Freq: Once | ORAL | Status: AC | PRN

## 2011-11-05 MED ORDER — HYDROMORPHONE HCL PF 1 MG/ML IJ SOLN: 0.5000 mg | INTRAMUSCULAR | Status: DC | PRN

## 2011-11-05 MED ORDER — ONDANSETRON HCL 4 MG PO TABS: 4.0000 mg | ORAL_TABLET | Freq: Four times a day (QID) | ORAL | Status: DC | PRN

## 2011-11-05 MED ORDER — CEFAZOLIN SODIUM-DEXTROSE 2-3 GM-% IV SOLR
2.0000 g | Freq: Four times a day (QID) | INTRAVENOUS | Status: AC
  Administered 2011-11-05 – 2011-11-06 (×2): 2 g via INTRAVENOUS

## 2011-11-05 MED ORDER — OXYCODONE-ACETAMINOPHEN 5-325 MG PO TABS
1.0000 | ORAL_TABLET | ORAL | Status: DC | PRN
  Administered 2011-11-05 – 2011-11-06 (×4): 2 via ORAL

## 2011-11-05 MED ORDER — METHOCARBAMOL 500 MG PO TABS
500.0000 mg | ORAL_TABLET | Freq: Four times a day (QID) | ORAL | Status: DC | PRN
  Administered 2011-11-05 – 2011-11-06 (×3): 500 mg via ORAL

## 2011-11-05 MED ORDER — METOCLOPRAMIDE HCL 5 MG/ML IJ SOLN: 5.0000 mg | Freq: Three times a day (TID) | INTRAMUSCULAR | Status: DC | PRN

## 2011-11-05 MED ORDER — PROMETHAZINE HCL 25 MG/ML IJ SOLN: 6.2500 mg | INTRAMUSCULAR | Status: DC | PRN

## 2011-11-05 MED ORDER — ONDANSETRON HCL 4 MG/2ML IJ SOLN
INTRAMUSCULAR | Status: DC | PRN
  Administered 2011-11-05: 4 mg via INTRAVENOUS

## 2011-11-05 MED ORDER — OXYCODONE HCL 5 MG PO TABS: 5.0000 mg | ORAL_TABLET | Freq: Once | ORAL | Status: AC | PRN

## 2011-11-05 MED ORDER — ZOLPIDEM TARTRATE 5 MG PO TABS
5.0000 mg | ORAL_TABLET | Freq: Every evening | ORAL | Status: DC | PRN
  Administered 2011-11-06: 5 mg via ORAL

## 2011-11-05 MED ORDER — PROPOFOL 10 MG/ML IV BOLUS
INTRAVENOUS | Status: DC | PRN
  Administered 2011-11-05: 150 mg via INTRAVENOUS

## 2011-11-05 MED ORDER — BUPIVACAINE-EPINEPHRINE PF 0.5-1:200000 % IJ SOLN
INTRAMUSCULAR | Status: DC | PRN
  Administered 2011-11-05: 150 mg

## 2011-11-05 SURGICAL SUPPLY — 61 items
APL SKNCLS STERI-STRIP NONHPOA (GAUZE/BANDAGES/DRESSINGS) ×1
BENZOIN TINCTURE PRP APPL 2/3 (GAUZE/BANDAGES/DRESSINGS) ×2 IMPLANT
BLADE AVERAGE 25X9 (BLADE) ×1 IMPLANT
BLADE SURG 10 STRL SS (BLADE) ×1 IMPLANT
BLADE SURG 15 STRL LF DISP TIS (BLADE) ×1 IMPLANT
BLADE SURG 15 STRL SS (BLADE) ×2
CANISTER SUCTION 2500CC (MISCELLANEOUS) ×2 IMPLANT
CLEANER CAUTERY TIP 5X5 PAD (MISCELLANEOUS) ×1 IMPLANT
CrossFT Suture Anchor (Anchor) ×2 IMPLANT
DECANTER SPIKE VIAL GLASS SM (MISCELLANEOUS) ×2 IMPLANT
DRAPE U-SHAPE 47X51 STRL (DRAPES) ×2 IMPLANT
DRSG EMULSION OIL 3X3 NADH (GAUZE/BANDAGES/DRESSINGS) ×2 IMPLANT
DRSG PAD ABDOMINAL 8X10 ST (GAUZE/BANDAGES/DRESSINGS) ×2 IMPLANT
DURAPREP 26ML APPLICATOR (WOUND CARE) ×2 IMPLANT
ELECT REM PT RETURN 9FT ADLT (ELECTROSURGICAL) ×2
ELECTRODE REM PT RTRN 9FT ADLT (ELECTROSURGICAL) ×1 IMPLANT
GLOVE BIO SURGEON STRL SZ 6.5 (GLOVE) ×2 IMPLANT
GLOVE BIO SURGEON STRL SZ8.5 (GLOVE) ×2 IMPLANT
GLOVE BIOGEL PI IND STRL 7.0 (GLOVE) IMPLANT
GLOVE BIOGEL PI IND STRL 8 (GLOVE) ×1 IMPLANT
GLOVE BIOGEL PI IND STRL 8.5 (GLOVE) ×1 IMPLANT
GLOVE BIOGEL PI INDICATOR 7.0 (GLOVE) ×1
GLOVE BIOGEL PI INDICATOR 8 (GLOVE) ×1
GLOVE BIOGEL PI INDICATOR 8.5 (GLOVE) ×1
GLOVE SS BIOGEL STRL SZ 8 (GLOVE) ×1 IMPLANT
GLOVE SUPERSENSE BIOGEL SZ 8 (GLOVE) ×1
GOWN PREVENTION PLUS XLARGE (GOWN DISPOSABLE) ×2 IMPLANT
GOWN PREVENTION PLUS XXLARGE (GOWN DISPOSABLE) ×3 IMPLANT
NDL SUT 6 .5 CRC .975X.05 MAYO (NEEDLE) IMPLANT
NEEDLE MAYO TAPER (NEEDLE) ×2
NS IRRIG 1000ML POUR BTL (IV SOLUTION) ×2 IMPLANT
PACK ARTHROSCOPY DSU (CUSTOM PROCEDURE TRAY) ×2 IMPLANT
PACK BASIN DAY SURGERY FS (CUSTOM PROCEDURE TRAY) ×2 IMPLANT
PAD CLEANER CAUTERY TIP 5X5 (MISCELLANEOUS) ×1
PASSER SUT SWANSON 36MM LOOP (INSTRUMENTS) ×1 IMPLANT
PENCIL BUTTON HOLSTER BLD 10FT (ELECTRODE) ×2 IMPLANT
SLEEVE SCD COMPRESS KNEE MED (MISCELLANEOUS) ×2 IMPLANT
SLING ARM FOAM STRAP LRG (SOFTGOODS) ×1 IMPLANT
SLING ARM FOAM STRAP MED (SOFTGOODS) IMPLANT
SLING ARM FOAM STRAP XLG (SOFTGOODS) IMPLANT
SLING ARM IMMOBILIZER LRG (SOFTGOODS) ×1 IMPLANT
SPONGE GAUZE 4X4 12PLY (GAUZE/BANDAGES/DRESSINGS) ×1 IMPLANT
SPONGE LAP 4X18 X RAY DECT (DISPOSABLE) ×3 IMPLANT
STRIP CLOSURE SKIN 1/2X4 (GAUZE/BANDAGES/DRESSINGS) ×2 IMPLANT
SUCTION FRAZIER TIP 10 FR DISP (SUCTIONS) ×2 IMPLANT
SUT ETHILON 3 0 PS 1 (SUTURE) ×2 IMPLANT
SUT ETHILON 4 0 PS 2 18 (SUTURE) IMPLANT
SUT FIBERWIRE #2 38 T-5 BLUE (SUTURE)
SUT PROLENE 3 0 PS 2 (SUTURE) IMPLANT
SUT VIC AB 1 CT1 27 (SUTURE) ×4
SUT VIC AB 1 CT1 27XBRD ANBCTR (SUTURE) ×1 IMPLANT
SUT VIC AB 2-0 CT1 27 (SUTURE)
SUT VIC AB 2-0 CT1 TAPERPNT 27 (SUTURE) IMPLANT
SUT VIC AB 2-0 SH 27 (SUTURE) ×2
SUT VIC AB 2-0 SH 27XBRD (SUTURE) ×1 IMPLANT
SUTURE FIBERWR #2 38 T-5 BLUE (SUTURE) IMPLANT
SYR BULB 3OZ (MISCELLANEOUS) ×2 IMPLANT
TOWEL OR 17X24 6PK STRL BLUE (TOWEL DISPOSABLE) ×2 IMPLANT
TOWEL OR NON WOVEN STRL DISP B (DISPOSABLE) ×2 IMPLANT
UNDERPAD 30X30 INCONTINENT (UNDERPADS AND DIAPERS) ×2 IMPLANT
YANKAUER SUCT BULB TIP NO VENT (SUCTIONS) ×2 IMPLANT

## 2011-11-05 NOTE — Interval H&P Note (Signed)
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 After consideration of risks, benefits and other options for treatment, the patient has consented to  Procedure(s) (LRB) with comments: ROTATOR CUFF REPAIR SHOULDER OPEN (Right) - RIGHT SHOULDER OPEN ROTATOR CUFF REPAIR BICEPS TENODESIS (Right) -  TENODESIS AC RESECTION  as a surgical intervention .  The patient's history has been reviewed, patient examined, no change in status, stable for surgery.  I have reviewed the patient's chart and labs.  Questions were answered to the patient's satisfaction.     [name] G

## 2011-11-05 NOTE — Anesthesia Procedure Notes (Addendum)
Anesthesia Regional Block:  Interscalene brachial plexus block  Pre-Anesthetic Checklist: ,, timeout performed, Correct Patient, Correct Site, Correct Laterality, Correct Procedure,, site marked, risks and benefits discussed, Surgical consent,  Pre-op evaluation,  At surgeon's request and post-op pain management  Laterality: Right  Prep: chloraprep       Needles:  Injection technique: Single-shot  Needle Type: Echogenic Stimulator Needle     Needle Length: 5cm 5 cm Needle Gauge: 22 and 22 G    Additional Needles:  Procedures: ultrasound guided and nerve stimulator Interscalene brachial plexus block  Nerve Stimulator or Paresthesia:  Response: bicep contraction, 0.45 mA,   Additional Responses:   Narrative:  Start time: 11/05/2011 6:55 AM End time: 11/05/2011 7:06 AM Injection made incrementally with aspirations every 5 mL.  Performed by: Personally  Anesthesiologist: J. Adonis Huguenin, MD  Additional Notes: Functioning IV was confirmed and monitors applied.  A 50mm 22ga echogenic arrow stimulator was used. Sterile prep and drape,hand hygiene and sterile gloves were used.Ultrasound guidance: relevant anatomy identified, needle position confirmed, local anesthetic spread visualized around nerve(s)., vascular puncture avoided.  Image printed for medical record.  Negative aspiration and negative test dose prior to incremental administration of local anesthetic. The patient tolerated the procedure well.  Interscalene brachial plexus block Procedure Name: Intubation Date/Time: 11/05/2011 7:38 AM Performed by: Zayden Maffei D Pre-anesthesia Checklist: Patient identified, Emergency Drugs available, Suction available and Patient being monitored Patient Re-evaluated:Patient Re-evaluated prior to inductionOxygen Delivery Method: Circle System Utilized Preoxygenation: Pre-oxygenation with 100% oxygen Intubation Type: IV induction Ventilation: Mask ventilation without  difficulty Laryngoscope Size: Mac and 3 Grade View: Grade II Tube type: Oral Tube size: 7.0 mm Number of attempts: 1 Airway Equipment and Method: stylet and LTA kit utilized Placement Confirmation: ETT inserted through vocal cords under direct vision,  positive ETCO2 and breath sounds checked- equal and bilateral Secured at: 21 cm Tube secured with: Tape Dental Injury: Teeth and Oropharynx as per pre-operative assessment

## 2011-11-05 NOTE — Progress Notes (Signed)
Assisted Dr. Singer with right, ultrasound guided, interscalene  block. Side rails up, monitors on throughout procedure. See vital signs in flow sheet. Tolerated Procedure well. 

## 2011-11-05 NOTE — Op Note (Signed)
#  [id number] 

## 2011-11-05 NOTE — Transfer of Care (Signed)
Immediate Anesthesia Transfer of Care Note  Patient: Lacey [name]  Procedure(s) Performed: Procedure(s) (LRB) with comments: ROTATOR CUFF REPAIR SHOULDER OPEN (Right) - RIGHT SHOULDER OPEN ROTATOR CUFF REPAIR BICEPS TENODESIS (Right) -  TENODESIS  AND  ACRMIOCLAVICULAR RESECTION   Patient Location: PACU  Anesthesia Type: GA combined with regional for post-op pain  Level of Consciousness: awake, alert , oriented and patient cooperative  Airway & Oxygen Therapy: Patient Spontanous Breathing and Patient connected to face mask oxygen  Post-op Assessment: Report given to PACU RN and Post -op Vital signs reviewed and stable  Post vital signs: Reviewed and stable  Complications: No apparent anesthesia complications

## 2011-11-05 NOTE — Anesthesia Postprocedure Evaluation (Signed)
Anesthesia Post Note  Patient: Lacey [name]  Procedure(s) Performed: Procedure(s) (LRB): ROTATOR CUFF REPAIR SHOULDER OPEN (Right) BICEPS TENODESIS (Right)  Anesthesia type: general  Patient location: PACU  Post pain: Pain level controlled  Post assessment: Patient's Cardiovascular Status Stable  Last Vitals:  Filed Vitals:   11/05/11 0950  BP:   Pulse: 62  Temp:   Resp: 13    Post vital signs: Reviewed and stable  Level of consciousness: sedated  Complications: No apparent anesthesia complications

## 2011-11-05 NOTE — Anesthesia Preprocedure Evaluation (Signed)
Anesthesia Evaluation  Patient identified by MRN, date of birth, ID band Patient awake    Reviewed: Allergy & Precautions, H&P , NPO status , Patient's Chart, lab work & pertinent test results  Airway Mallampati: II TM Distance: >3 FB Neck ROM: full    Dental  (+) Teeth Intact and Dental Advidsory Given   Pulmonary neg pulmonary ROS,    Pulmonary exam normal       Cardiovascular negative cardio ROS  Rhythm:regular Rate:Normal     Neuro/Psych    GI/Hepatic Neg liver ROS, GERD-  Medicated and Controlled,  Endo/Other  negative endocrine ROS  Renal/GU negative Renal ROS     Musculoskeletal   Abdominal Normal abdominal exam  (+)   Peds  Hematology   Anesthesia Other Findings   Reproductive/Obstetrics                           Anesthesia Physical Anesthesia Plan  ASA: II  Anesthesia Plan: General ETT   Post-op Pain Management: MAC Combined w/ Regional for Post-op pain   Induction:   Airway Management Planned:   Additional Equipment:   Intra-op Plan:   Post-operative Plan:   Informed Consent: I have reviewed the patients History and Physical, chart, labs and discussed the procedure including the risks, benefits and alternatives for the proposed anesthesia with the patient or authorized representative who has indicated his/her understanding and acceptance.   Dental Advisory Given  Plan Discussed with: Anesthesiologist, CRNA and Surgeon  Anesthesia Plan Comments:         Anesthesia Quick Evaluation

## 2011-11-06 NOTE — Op Note (Signed)
NAMEBARBE, THORUP               ACCOUNT NO.:  [id number]  MEDICAL RECORD NO.:  [id number]  LOCATION:                                 FACILITY:  PHYSICIAN:  Lubertha Basque. Caitlyne Ingham, M.D.DATE OF BIRTH:  10/31/1955  DATE OF PROCEDURE:  11/05/2011 DATE OF DISCHARGE:                              OPERATIVE REPORT   PREOPERATIVE DIAGNOSES: 1. Right subscapularis rupture. 2. Right biceps tendon tear. 3. Right shoulder acromioclavicular degeneration and spur.  POSTOPERATIVE DIAGNOSES: 1. Right subscapularis rupture. 2. Right biceps tendon tear. 3. Right shoulder acromioclavicular degeneration and spur.  PROCEDURES: 1. Right shoulder open acromioclavicular resection. 2. Right shoulder open rotator cuff repair. 3. Right shoulder open biceps tenodesis.  ANESTHESIA:  General and block.  ATTENDING SURGEON:  Lubertha Basque. Jerl Santos, M.D.  ASSISTANT:  Lindwood Qua, P.A.  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  A postfall MRI was different than her earlier MRI in that her subscapularis tendon at this point was completely ruptured and retracted.  She also had progression of the biceps degeneration and longitudinal tearing of this structure.  Additionally, she had an issue with her [facility] joint, which was made worse by the fall.  She had pain and degeneration, but also a superior aspect spur tenting the skin.  For multitude of reasons, we elected to address this all through one open incision.  Informed operative consent was obtained after discussion of possible complications including reaction to anesthesia, infection, neurovascular injury, and failure of repair.  SUMMARY OF FINDINGS AND PROCEDURE:  Under general anesthesia and a block through an extended deltopectoral approach, we performed three above- mentioned  procedures.  We first performed the open [facility] resection and removing 1 cm of distal clavicle with the saw.  The soft tissues were repaired over this joint and she had no instability.  We subsequently performed the deltopectoral approach more deeply and discovered a complete subscapularis rupture with a couple of centimeters of retraction.  She also had extensive tearing of the biceps tendon through the bicipital groove.  I performed a biceps tenodesis and subscapularis repair utilizing two corkscrew anchors by [employer].  Lindwood Qua assisted throughout and was invaluable to the completion of the case, in that, he helped retract and pass instruments and make this possible.  He also closed simultaneously to help minimize OR time.  DESCRIPTION OF PROCEDURE:  The patient was taken to the operating suite where general anesthetic was applied.  She was also given a block in the pre-anesthesia area.  She was positioned in beach-chair position and prepped and draped in normal sterile fashion.  After the administration of IV Kefzol and an appropriate time out, we performed an extended deltopectoral approach to the right shoulder.  This did extend over the distal clavicle down the near the axilla.  We dissected down and first addressed the [facility] joint.  I made a longitudinal incision over the [facility] joint and created a soft tissue sleeve anterior and posterior.  I then used a saw to remove the distal centimeter of  the clavicle including the large superior aspect spur.  This was then smoothed off at the cut edge with a rongeur.  We thoroughly irrigated the area and then reapproximated the aforementioned sleeves of tissue with #1 Vicryl in interrupted fashion.  I then performed the rest of the deltopectoral approach, taken the conjoint tendon in a medial direction.  The subscapularis was exposed and was completely ruptured and retracted 1 cm or 2.  She seemed to have good tissues which were mobile.  The  biceps tendon was severely degenerative through the bicipital groove with longitudinal tearing.  I released this tendon off the superior aspect of the glenoid with some scissors.  The tendon was pulled down into our wound.  I then created a bleeding bed of bone near the lesser tuberosity and placed two Linvatec corkscrew anchors with four sutures emanating. These were then passed through the subscapularis tendon, and two limbs were passed through the biceps tendon at the appropriate spot for tenodesis.  I removed the excess biceps tendon.  Our subscapularis was then tied down to the lesser tuberosity area.  This seemed to give [place] a nice tight repair.  The wound was then thoroughly irrigated.  She could easily rotate 30 degrees with a nice repair.  We again irrigated followed by reapproximation of the deltopectoral interval with 2-0 undyed Vicryl.  I repaired subcutaneous tissues with 2-0 undyed Vicryl and skin with nylon.  We then applied a sterile dressing with Adaptic, dry gauze, and tape.  Estimated blood loss and intraoperative fluids were obtained from anesthesia records.  DISPOSITION:  The patient was extubated in the operating room and taken to the recovery room in stable condition.  She was to go home same-day and follow up in the office next week.  I will contact her by phone tonight.     Lubertha Basque Jerl Santos, M.D.     PGD/MEDQ  D:  11/05/2011  T:  11/05/2011  Job:  [id number]

## 2011-11-12 ENCOUNTER — Encounter (HOSPITAL_BASED_OUTPATIENT_CLINIC_OR_DEPARTMENT_OTHER): Payer: Self-pay | Admitting: Orthopaedic Surgery

## 2011-12-09 ENCOUNTER — Ambulatory Visit: Payer: PRIVATE HEALTH INSURANCE | Attending: Orthopaedic Surgery | Admitting: Physical Therapy

## 2011-12-09 DIAGNOSIS — W19XXXS Unspecified fall, sequela: Secondary | ICD-10-CM | POA: Insufficient documentation

## 2011-12-09 DIAGNOSIS — M25619 Stiffness of unspecified shoulder, not elsewhere classified: Secondary | ICD-10-CM | POA: Insufficient documentation

## 2011-12-09 DIAGNOSIS — IMO0001 Reserved for inherently not codable concepts without codable children: Secondary | ICD-10-CM | POA: Insufficient documentation

## 2011-12-09 DIAGNOSIS — M25519 Pain in unspecified shoulder: Secondary | ICD-10-CM | POA: Insufficient documentation

## 2011-12-11 ENCOUNTER — Ambulatory Visit: Payer: PRIVATE HEALTH INSURANCE | Admitting: Physical Therapy

## 2011-12-16 ENCOUNTER — Ambulatory Visit: Payer: PRIVATE HEALTH INSURANCE | Attending: Orthopaedic Surgery | Admitting: Physical Therapy

## 2011-12-16 DIAGNOSIS — W19XXXS Unspecified fall, sequela: Secondary | ICD-10-CM | POA: Insufficient documentation

## 2011-12-16 DIAGNOSIS — M25619 Stiffness of unspecified shoulder, not elsewhere classified: Secondary | ICD-10-CM | POA: Insufficient documentation

## 2011-12-16 DIAGNOSIS — M25519 Pain in unspecified shoulder: Secondary | ICD-10-CM | POA: Insufficient documentation

## 2011-12-16 DIAGNOSIS — IMO0001 Reserved for inherently not codable concepts without codable children: Secondary | ICD-10-CM | POA: Insufficient documentation

## 2011-12-18 ENCOUNTER — Ambulatory Visit: Payer: PRIVATE HEALTH INSURANCE | Attending: Orthopaedic Surgery | Admitting: Physical Therapy

## 2011-12-18 DIAGNOSIS — M25619 Stiffness of unspecified shoulder, not elsewhere classified: Secondary | ICD-10-CM | POA: Insufficient documentation

## 2011-12-18 DIAGNOSIS — IMO0001 Reserved for inherently not codable concepts without codable children: Secondary | ICD-10-CM | POA: Insufficient documentation

## 2011-12-18 DIAGNOSIS — M25519 Pain in unspecified shoulder: Secondary | ICD-10-CM | POA: Insufficient documentation

## 2011-12-23 ENCOUNTER — Ambulatory Visit: Payer: PRIVATE HEALTH INSURANCE | Attending: Orthopaedic Surgery | Admitting: Physical Therapy

## 2011-12-23 DIAGNOSIS — M25619 Stiffness of unspecified shoulder, not elsewhere classified: Secondary | ICD-10-CM | POA: Insufficient documentation

## 2011-12-23 DIAGNOSIS — M25519 Pain in unspecified shoulder: Secondary | ICD-10-CM | POA: Insufficient documentation

## 2011-12-23 DIAGNOSIS — IMO0001 Reserved for inherently not codable concepts without codable children: Secondary | ICD-10-CM | POA: Insufficient documentation

## 2011-12-25 ENCOUNTER — Encounter: Payer: Self-pay | Admitting: Physical Therapy

## 2011-12-26 ENCOUNTER — Ambulatory Visit: Payer: PRIVATE HEALTH INSURANCE | Attending: Orthopaedic Surgery | Admitting: Physical Therapy

## 2011-12-26 DIAGNOSIS — M25619 Stiffness of unspecified shoulder, not elsewhere classified: Secondary | ICD-10-CM | POA: Insufficient documentation

## 2011-12-26 DIAGNOSIS — IMO0001 Reserved for inherently not codable concepts without codable children: Secondary | ICD-10-CM | POA: Insufficient documentation

## 2011-12-26 DIAGNOSIS — M25519 Pain in unspecified shoulder: Secondary | ICD-10-CM | POA: Insufficient documentation

## 2011-12-30 ENCOUNTER — Ambulatory Visit: Payer: PRIVATE HEALTH INSURANCE | Attending: Orthopaedic Surgery | Admitting: Physical Therapy

## 2011-12-30 DIAGNOSIS — M25519 Pain in unspecified shoulder: Secondary | ICD-10-CM | POA: Insufficient documentation

## 2011-12-30 DIAGNOSIS — M25619 Stiffness of unspecified shoulder, not elsewhere classified: Secondary | ICD-10-CM | POA: Insufficient documentation

## 2011-12-30 DIAGNOSIS — IMO0001 Reserved for inherently not codable concepts without codable children: Secondary | ICD-10-CM | POA: Insufficient documentation

## 2012-01-01 ENCOUNTER — Ambulatory Visit: Payer: PRIVATE HEALTH INSURANCE | Attending: Orthopaedic Surgery | Admitting: Physical Therapy

## 2012-01-01 DIAGNOSIS — IMO0001 Reserved for inherently not codable concepts without codable children: Secondary | ICD-10-CM | POA: Insufficient documentation

## 2012-01-01 DIAGNOSIS — M25519 Pain in unspecified shoulder: Secondary | ICD-10-CM | POA: Insufficient documentation

## 2012-01-01 DIAGNOSIS — M25619 Stiffness of unspecified shoulder, not elsewhere classified: Secondary | ICD-10-CM | POA: Insufficient documentation

## 2012-01-06 ENCOUNTER — Ambulatory Visit: Payer: PRIVATE HEALTH INSURANCE | Attending: Orthopaedic Surgery | Admitting: Physical Therapy

## 2012-01-06 DIAGNOSIS — M25619 Stiffness of unspecified shoulder, not elsewhere classified: Secondary | ICD-10-CM | POA: Insufficient documentation

## 2012-01-06 DIAGNOSIS — M25519 Pain in unspecified shoulder: Secondary | ICD-10-CM | POA: Insufficient documentation

## 2012-01-06 DIAGNOSIS — IMO0001 Reserved for inherently not codable concepts without codable children: Secondary | ICD-10-CM | POA: Insufficient documentation

## 2012-01-07 ENCOUNTER — Ambulatory Visit: Payer: Self-pay | Admitting: Physical Therapy

## 2012-01-09 ENCOUNTER — Ambulatory Visit: Payer: PRIVATE HEALTH INSURANCE | Attending: Orthopaedic Surgery | Admitting: Physical Therapy

## 2012-01-09 DIAGNOSIS — M25519 Pain in unspecified shoulder: Secondary | ICD-10-CM | POA: Insufficient documentation

## 2012-01-09 DIAGNOSIS — M25619 Stiffness of unspecified shoulder, not elsewhere classified: Secondary | ICD-10-CM | POA: Insufficient documentation

## 2012-01-09 DIAGNOSIS — IMO0001 Reserved for inherently not codable concepts without codable children: Secondary | ICD-10-CM | POA: Insufficient documentation

## 2012-01-13 ENCOUNTER — Encounter: Payer: Self-pay | Admitting: Physical Therapy

## 2012-01-16 ENCOUNTER — Ambulatory Visit: Payer: PRIVATE HEALTH INSURANCE | Attending: Orthopedic Surgery | Admitting: Physical Therapy

## 2012-01-16 DIAGNOSIS — M25569 Pain in unspecified knee: Secondary | ICD-10-CM | POA: Insufficient documentation

## 2012-01-16 DIAGNOSIS — R262 Difficulty in walking, not elsewhere classified: Secondary | ICD-10-CM | POA: Insufficient documentation

## 2012-01-16 DIAGNOSIS — IMO0001 Reserved for inherently not codable concepts without codable children: Secondary | ICD-10-CM | POA: Insufficient documentation

## 2012-01-17 ENCOUNTER — Ambulatory Visit: Payer: PRIVATE HEALTH INSURANCE | Attending: Orthopaedic Surgery | Admitting: Physical Therapy

## 2012-01-17 DIAGNOSIS — M25569 Pain in unspecified knee: Secondary | ICD-10-CM | POA: Insufficient documentation

## 2012-01-17 DIAGNOSIS — IMO0001 Reserved for inherently not codable concepts without codable children: Secondary | ICD-10-CM | POA: Insufficient documentation

## 2012-01-17 DIAGNOSIS — R262 Difficulty in walking, not elsewhere classified: Secondary | ICD-10-CM | POA: Insufficient documentation

## 2012-01-20 ENCOUNTER — Ambulatory Visit: Payer: PRIVATE HEALTH INSURANCE | Attending: Orthopaedic Surgery | Admitting: Physical Therapy

## 2012-01-20 DIAGNOSIS — M25569 Pain in unspecified knee: Secondary | ICD-10-CM | POA: Insufficient documentation

## 2012-01-20 DIAGNOSIS — R262 Difficulty in walking, not elsewhere classified: Secondary | ICD-10-CM | POA: Insufficient documentation

## 2012-01-20 DIAGNOSIS — IMO0001 Reserved for inherently not codable concepts without codable children: Secondary | ICD-10-CM | POA: Insufficient documentation

## 2012-01-23 ENCOUNTER — Ambulatory Visit: Payer: PRIVATE HEALTH INSURANCE | Attending: Orthopaedic Surgery | Admitting: Physical Therapy

## 2012-01-23 DIAGNOSIS — M25569 Pain in unspecified knee: Secondary | ICD-10-CM | POA: Insufficient documentation

## 2012-01-23 DIAGNOSIS — IMO0001 Reserved for inherently not codable concepts without codable children: Secondary | ICD-10-CM | POA: Insufficient documentation

## 2012-01-23 DIAGNOSIS — R262 Difficulty in walking, not elsewhere classified: Secondary | ICD-10-CM | POA: Insufficient documentation

## 2012-01-28 ENCOUNTER — Ambulatory Visit: Payer: PRIVATE HEALTH INSURANCE | Admitting: Physical Therapy

## 2012-01-31 ENCOUNTER — Ambulatory Visit: Payer: Self-pay | Admitting: Physical Therapy

## 2012-02-03 ENCOUNTER — Ambulatory Visit: Payer: PRIVATE HEALTH INSURANCE | Attending: Orthopaedic Surgery | Admitting: Physical Therapy

## 2012-02-03 ENCOUNTER — Ambulatory Visit: Payer: Self-pay | Admitting: Physical Therapy

## 2012-02-03 DIAGNOSIS — R262 Difficulty in walking, not elsewhere classified: Secondary | ICD-10-CM | POA: Insufficient documentation

## 2012-02-03 DIAGNOSIS — M25569 Pain in unspecified knee: Secondary | ICD-10-CM | POA: Insufficient documentation

## 2012-02-03 DIAGNOSIS — IMO0001 Reserved for inherently not codable concepts without codable children: Secondary | ICD-10-CM | POA: Insufficient documentation

## 2012-02-05 ENCOUNTER — Ambulatory Visit: Payer: PRIVATE HEALTH INSURANCE | Admitting: Physical Therapy

## 2012-02-05 ENCOUNTER — Encounter: Payer: Self-pay | Admitting: Physical Therapy

## 2012-02-10 ENCOUNTER — Ambulatory Visit: Payer: PRIVATE HEALTH INSURANCE | Admitting: Physical Therapy

## 2012-02-12 ENCOUNTER — Ambulatory Visit: Payer: PRIVATE HEALTH INSURANCE | Attending: Orthopaedic Surgery | Admitting: Physical Therapy

## 2012-02-12 DIAGNOSIS — R262 Difficulty in walking, not elsewhere classified: Secondary | ICD-10-CM | POA: Insufficient documentation

## 2012-02-12 DIAGNOSIS — M25569 Pain in unspecified knee: Secondary | ICD-10-CM | POA: Insufficient documentation

## 2012-02-12 DIAGNOSIS — IMO0001 Reserved for inherently not codable concepts without codable children: Secondary | ICD-10-CM | POA: Insufficient documentation

## 2012-02-18 ENCOUNTER — Encounter: Payer: Self-pay | Admitting: Physical Therapy

## 2012-02-20 ENCOUNTER — Encounter: Payer: Self-pay | Admitting: Physical Therapy

## 2012-02-27 ENCOUNTER — Ambulatory Visit: Payer: Self-pay | Admitting: Physical Therapy

## 2012-02-27 ENCOUNTER — Ambulatory Visit: Payer: PRIVATE HEALTH INSURANCE | Admitting: Physical Therapy

## 2012-02-28 ENCOUNTER — Ambulatory Visit: Payer: Self-pay | Admitting: Physical Therapy

## 2012-03-26 ENCOUNTER — Ambulatory Visit: Payer: PRIVATE HEALTH INSURANCE | Admitting: Physical Therapy

## 2012-04-02 ENCOUNTER — Ambulatory Visit: Payer: PRIVATE HEALTH INSURANCE | Admitting: Physical Therapy

## 2012-04-09 ENCOUNTER — Encounter: Payer: Self-pay | Admitting: Physical Therapy

## 2012-04-16 ENCOUNTER — Encounter: Payer: Self-pay | Admitting: Physical Therapy

## 2012-04-23 ENCOUNTER — Ambulatory Visit: Payer: Self-pay | Admitting: Physical Therapy

## 2012-04-23 ENCOUNTER — Ambulatory Visit: Payer: PRIVATE HEALTH INSURANCE | Attending: Orthopaedic Surgery | Admitting: Physical Therapy

## 2012-04-23 ENCOUNTER — Other Ambulatory Visit (HOSPITAL_COMMUNITY): Payer: Self-pay | Admitting: Family Medicine

## 2012-04-23 DIAGNOSIS — Z1231 Encounter for screening mammogram for malignant neoplasm of breast: Secondary | ICD-10-CM

## 2012-04-23 DIAGNOSIS — M545 Low back pain, unspecified: Secondary | ICD-10-CM | POA: Insufficient documentation

## 2012-04-23 DIAGNOSIS — IMO0001 Reserved for inherently not codable concepts without codable children: Secondary | ICD-10-CM | POA: Insufficient documentation

## 2012-04-24 ENCOUNTER — Ambulatory Visit (HOSPITAL_COMMUNITY)
Admission: RE | Admit: 2012-04-24 | Discharge: 2012-04-24 | Disposition: A | Discharge status: Home / Self Care | Payer: 59 | Source: Ambulatory Visit | Attending: Family Medicine | Admitting: Family Medicine

## 2012-04-24 DIAGNOSIS — Z1231 Encounter for screening mammogram for malignant neoplasm of breast: Secondary | ICD-10-CM | POA: Insufficient documentation

## 2012-04-30 ENCOUNTER — Ambulatory Visit: Payer: Self-pay | Admitting: Physical Therapy

## 2012-05-04 ENCOUNTER — Encounter: Payer: Self-pay | Admitting: Physical Therapy

## 2012-06-04 ENCOUNTER — Ambulatory Visit: Payer: PRIVATE HEALTH INSURANCE | Attending: Orthopaedic Surgery | Admitting: Physical Therapy

## 2012-06-04 DIAGNOSIS — IMO0001 Reserved for inherently not codable concepts without codable children: Secondary | ICD-10-CM | POA: Insufficient documentation

## 2012-06-04 DIAGNOSIS — R5381 Other malaise: Secondary | ICD-10-CM | POA: Insufficient documentation

## 2012-06-04 DIAGNOSIS — M545 Low back pain, unspecified: Secondary | ICD-10-CM | POA: Insufficient documentation

## 2012-06-18 ENCOUNTER — Ambulatory Visit: Payer: PRIVATE HEALTH INSURANCE | Attending: Orthopaedic Surgery | Admitting: Physical Therapy

## 2012-06-18 DIAGNOSIS — IMO0001 Reserved for inherently not codable concepts without codable children: Secondary | ICD-10-CM | POA: Insufficient documentation

## 2012-06-18 DIAGNOSIS — M545 Low back pain, unspecified: Secondary | ICD-10-CM | POA: Insufficient documentation

## 2012-06-18 DIAGNOSIS — R5381 Other malaise: Secondary | ICD-10-CM | POA: Insufficient documentation

## 2012-06-25 ENCOUNTER — Encounter: Payer: Self-pay | Admitting: Physical Therapy

## 2012-07-09 ENCOUNTER — Ambulatory Visit: Payer: PRIVATE HEALTH INSURANCE | Admitting: Physical Therapy

## 2012-08-05 ENCOUNTER — Ambulatory Visit: Payer: PRIVATE HEALTH INSURANCE | Attending: Orthopaedic Surgery | Admitting: Physical Therapy

## 2013-01-14 HISTORY — PX: JOINT REPLACEMENT: SHX530

## 2013-03-24 ENCOUNTER — Emergency Department (HOSPITAL_COMMUNITY)
Admission: EM | Admit: 2013-03-24 | Discharge: 2013-03-24 | Disposition: A | Discharge status: Home / Self Care | Payer: 59 | Source: Home / Self Care

## 2013-09-08 ENCOUNTER — Other Ambulatory Visit (HOSPITAL_COMMUNITY): Payer: Self-pay | Admitting: Family Medicine

## 2013-09-08 DIAGNOSIS — Z1231 Encounter for screening mammogram for malignant neoplasm of breast: Secondary | ICD-10-CM

## 2013-09-15 ENCOUNTER — Ambulatory Visit (HOSPITAL_COMMUNITY): Payer: 59 | Attending: Family Medicine

## 2013-10-20 ENCOUNTER — Other Ambulatory Visit: Payer: Self-pay | Admitting: Orthopaedic Surgery

## 2013-10-27 ENCOUNTER — Encounter (HOSPITAL_COMMUNITY): Payer: Self-pay | Admitting: Pharmacy Technician

## 2013-11-01 ENCOUNTER — Other Ambulatory Visit (HOSPITAL_COMMUNITY): Payer: Self-pay

## 2013-11-01 ENCOUNTER — Encounter (HOSPITAL_COMMUNITY)
Admission: RE | Admit: 2013-11-01 | Discharge: 2013-11-01 | Disposition: A | Discharge status: Home / Self Care | Payer: PRIVATE HEALTH INSURANCE | Source: Ambulatory Visit | Attending: Orthopaedic Surgery | Admitting: Orthopaedic Surgery

## 2013-11-01 ENCOUNTER — Ambulatory Visit (HOSPITAL_COMMUNITY)
Admission: RE | Admit: 2013-11-01 | Discharge: 2013-11-01 | Disposition: A | Discharge status: Home / Self Care | Payer: PRIVATE HEALTH INSURANCE | Source: Ambulatory Visit | Attending: Orthopaedic Surgery | Admitting: Orthopaedic Surgery

## 2013-11-01 ENCOUNTER — Encounter (HOSPITAL_COMMUNITY): Payer: Self-pay

## 2013-11-01 DIAGNOSIS — Z01818 Encounter for other preprocedural examination: Secondary | ICD-10-CM | POA: Insufficient documentation

## 2013-11-01 DIAGNOSIS — M179 Osteoarthritis of knee, unspecified: Secondary | ICD-10-CM | POA: Diagnosis not present

## 2013-11-01 LAB — CBC WITH DIFFERENTIAL/PLATELET
BASOS ABS: 0 10*3/uL (ref 0.0–0.1)
Basophils Relative: 1 % (ref 0–1)
EOS ABS: 0.3 10*3/uL (ref 0.0–0.7)
Eosinophils Relative: 6 % — ABNORMAL HIGH (ref 0–5)
HCT: 38.5 % (ref 36.0–46.0)
Hemoglobin: 12.4 g/dL (ref 12.0–15.0)
LYMPHS ABS: 1.8 10*3/uL (ref 0.7–4.0)
Lymphocytes Relative: 42 % (ref 12–46)
MCH: 28.5 pg (ref 26.0–34.0)
MCHC: 32.2 g/dL (ref 30.0–36.0)
MCV: 88.5 fL (ref 78.0–100.0)
Monocytes Absolute: 0.2 10*3/uL (ref 0.1–1.0)
Monocytes Relative: 5 % (ref 3–12)
NEUTROS PCT: 46 % (ref 43–77)
Neutro Abs: 2 10*3/uL (ref 1.7–7.7)
Platelets: 334 10*3/uL (ref 150–400)
RBC: 4.35 MIL/uL (ref 3.87–5.11)
RDW: 13.7 % (ref 11.5–15.5)
WBC: 4.3 10*3/uL (ref 4.0–10.5)

## 2013-11-01 LAB — TYPE AND SCREEN
ABO/RH(D): A POS
Antibody Screen: NEGATIVE

## 2013-11-01 LAB — PROTIME-INR
INR: 0.96 (ref 0.00–1.49)
Prothrombin Time: 12.9 seconds (ref 11.6–15.2)

## 2013-11-01 LAB — BASIC METABOLIC PANEL
Anion gap: 14 (ref 5–15)
BUN: 16 mg/dL (ref 6–23)
CO2: 25 mEq/L (ref 19–32)
Calcium: 9.7 mg/dL (ref 8.4–10.5)
Chloride: 101 mEq/L (ref 96–112)
Creatinine, Ser: 0.76 mg/dL (ref 0.50–1.10)
GFR calc Af Amer: >90 mL/min (ref >90)
Glucose, Bld: 89 mg/dL (ref 70–99)
POTASSIUM: 4.2 meq/L (ref 3.7–5.3)
SODIUM: 140 meq/L (ref 137–147)

## 2013-11-01 LAB — SURGICAL PCR SCREEN
MRSA, PCR: NEGATIVE
STAPHYLOCOCCUS AUREUS: NEGATIVE

## 2013-11-01 LAB — APTT: aPTT: 30 seconds (ref 24–37)

## 2013-11-01 LAB — ABO/RH: ABO/RH(D): A POS

## 2013-11-01 NOTE — Pre-Procedure Instructions (Signed)
Lacey [name]  11/01/2013   Your procedure is scheduled on:  November 09, 2013  Report to [facility] Admitting at 5:30 AM.  Call this number if you have problems the morning of surgery: [phone]   Remember:   Do not eat food or drink liquids after midnight.   Take these medicines the morning of surgery with A SIP OF WATER: pain pill if needed   Do not wear jewelry, make-up or nail polish.  Do not wear lotions, powders, or perfumes. You may wear deodorant.  Do not shave 48 hours prior to surgery including arm pits and legs.   Do not bring valuables to the hospital.  [facility] is not responsible   for any belongings or valuables.               Contacts, dentures or bridgework may not be worn into surgery.  Leave suitcase in the car. After surgery it may be brought to your room.  For patients admitted to the hospital, discharge time is determined by your  treatment team.               Patients discharged the day of surgery will not be allowed to drive home.  Name and phone number of your driver: family/friend      Please read over the following fact sheets that you were given: Pain Booklet, Coughing and Deep Breathing, Blood Transfusion Information and Surgical Site Infection Prevention

## 2013-11-02 LAB — URINE CULTURE: Colony Count: 15000

## 2013-11-03 NOTE — H&P (Signed)
TOTAL KNEE ADMISSION H&P   Patient is being admitted for right total knee arthroplasty.  Subjective:  Chief Complaint:right knee pain.  HPI: Lacey [name], 58 y.o. female, has a history of pain and functional disability in the right knee due to arthritis and has failed non-surgical conservative treatments for greater than 12 weeks to includeNSAID's and/or analgesics, corticosteriod injections, viscosupplementation injections, supervised PT with diminished ADL's post treatment, weight reduction as appropriate and activity modification.  Onset of symptoms was gradual, starting 6 years ago with gradually worsening course since that time. The patient noted no past surgery on the right knee(s).  Patient currently rates pain in the right knee(s) at 10 out of 10 with activity. Patient has night pain, worsening of pain with activity and weight bearing, pain that interferes with activities of daily living, pain with passive range of motion, crepitus and joint swelling.  Patient has evidence of subchondral sclerosis, periarticular osteophytes and joint space narrowing by imaging studies. This patient has had no. There is no active infection.  There are no active problems to display for this patient.  Past Medical History  Diagnosis Date  . GERD (gastroesophageal reflux disease)     Past Surgical History  Procedure Laterality Date  . Orif ankle fracture  2009    left  . Hardware removal  2010    left ankle  . Upper gastrointestinal endoscopy    . Ercp  2012  . Total abdominal hysterectomy  2003    rt ovary,  . Anterior and posterior repair  2003    with the TAH  . Knee arthroscopy      right and left  . Tonsillectomy    . Shoulder open rotator cuff repair  11/05/2011    Procedure: ROTATOR CUFF REPAIR SHOULDER OPEN;  Surgeon: Hessie Dibble, MD;  Location: [place];  Service: Orthopedics;  Laterality: Right;  RIGHT SHOULDER OPEN ROTATOR CUFF REPAIR  . Bicept tenodesis   11/05/2011    Procedure: BICEPS TENODESIS;  Surgeon: Hessie Dibble, MD;  Location: [place];  Service: Orthopedics;  Laterality: Right;   TENODESIS  AND  ACRMIOCLAVICULAR RESECTION     No prescriptions prior to admission   No Known Allergies  History  Substance Use Topics  . Smoking status: Never Smoker   . Smokeless tobacco: Not on file  . Alcohol Use: Yes     Comment: wine daily    No family history on file.   Review of Systems  Constitutional: Negative.   HENT: Negative.   Eyes: Negative.   Respiratory: Negative.   Cardiovascular: Negative.   Gastrointestinal: Negative.   Genitourinary: Negative.   Musculoskeletal: Positive for joint pain.  Skin: Negative.   Neurological: Negative.   Endo/Heme/Allergies: Negative.   Psychiatric/Behavioral: Negative.     Objective:  Physical Exam  Constitutional: She appears well-developed.  HENT:  Head: Normocephalic.  Eyes: Pupils are equal, round, and reactive to light.  Neck: Normal range of motion.  Cardiovascular: Normal rate.   Respiratory: Effort normal.  GI: Soft.  Musculoskeletal:  Right knee exam range of motion 0-1 25.  Pain is severe medial joint line Trace effusion.  Crepitation 1+  Neurological: She is alert.  Skin: Skin is warm.  Psychiatric: She has a normal mood and affect.    Vital signs in last 24 hours:    Labs:   Estimated body mass index is 25.29 kg/(m^2) as calculated from the following:   Height as of 11/05/11:  5\' 5"  (1.651 m).   Weight as of 11/05/11: 68.947 kg (152 lb).   Imaging Review Plain radiographs demonstrate severe degenerative joint disease of the right knee(s). The overall alignment isneutral. The bone quality appears to be good for age and reported activity level.  Assessment/Plan:  End stage arthritis, right knee   The patient history, physical examination, clinical judgment of the provider and imaging studies are consistent with end stage degenerative joint  disease of the right knee(s) and total knee arthroplasty is deemed medically necessary. The treatment options including medical management, injection therapy arthroscopy and arthroplasty were discussed at length. The risks and benefits of total knee arthroplasty were presented and reviewed. The risks due to aseptic loosening, infection, stiffness, patella tracking problems, thromboembolic complications and other imponderables were discussed. The patient acknowledged the explanation, agreed to proceed with the plan and consent was signed. Patient is being admitted for inpatient treatment for surgery, pain control, PT, OT, prophylactic antibiotics, VTE prophylaxis, progressive ambulation and ADL's and discharge planning. The patient is planning to be discharged home with home health services

## 2013-11-08 MED ORDER — LACTATED RINGERS IV SOLN
INTRAVENOUS | Status: DC
  Administered 2013-11-09 (×2): via INTRAVENOUS

## 2013-11-08 MED ORDER — CEFAZOLIN SODIUM-DEXTROSE 2-3 GM-% IV SOLR
2.0000 g | INTRAVENOUS | Status: AC
  Administered 2013-11-09: 2 g via INTRAVENOUS
  Filled 2013-11-08: qty 50

## 2013-11-09 ENCOUNTER — Ambulatory Visit (HOSPITAL_COMMUNITY): Payer: PRIVATE HEALTH INSURANCE | Admitting: Anesthesiology

## 2013-11-09 ENCOUNTER — Encounter (HOSPITAL_COMMUNITY)
Admission: RE | Disposition: A | Discharge status: Home Health | Payer: Self-pay | Source: Ambulatory Visit | Attending: Orthopaedic Surgery

## 2013-11-09 ENCOUNTER — Inpatient Hospital Stay (HOSPITAL_COMMUNITY)
Admission: RE | Admit: 2013-11-09 | Discharge: 2013-11-11 | DRG: 470 | Disposition: A | Discharge status: Home Health | Payer: PRIVATE HEALTH INSURANCE | Source: Ambulatory Visit | Attending: Orthopaedic Surgery | Admitting: Orthopaedic Surgery

## 2013-11-09 ENCOUNTER — Encounter (HOSPITAL_COMMUNITY): Payer: PRIVATE HEALTH INSURANCE | Admitting: Anesthesiology

## 2013-11-09 ENCOUNTER — Encounter (HOSPITAL_COMMUNITY): Payer: Self-pay | Admitting: *Deleted

## 2013-11-09 DIAGNOSIS — K219 Gastro-esophageal reflux disease without esophagitis: Secondary | ICD-10-CM | POA: Diagnosis present

## 2013-11-09 DIAGNOSIS — M1711 Unilateral primary osteoarthritis, right knee: Principal | ICD-10-CM | POA: Diagnosis present

## 2013-11-09 DIAGNOSIS — M25561 Pain in right knee: Secondary | ICD-10-CM | POA: Diagnosis present

## 2013-11-09 HISTORY — PX: TOTAL KNEE ARTHROPLASTY: SHX125

## 2013-11-09 SURGERY — ARTHROPLASTY, KNEE, TOTAL
Anesthesia: Monitor Anesthesia Care | Site: Knee | Laterality: Right

## 2013-11-09 MED ORDER — PROPOFOL INFUSION 10 MG/ML OPTIME
INTRAVENOUS | Status: DC | PRN
  Administered 2013-11-09: 50 ug/kg/min via INTRAVENOUS

## 2013-11-09 MED ORDER — ACETAMINOPHEN 325 MG PO TABS
650.0000 mg | ORAL_TABLET | Freq: Four times a day (QID) | ORAL | Status: DC | PRN
  Administered 2013-11-09: 650 mg via ORAL
  Filled 2013-11-09 (×3): qty 2

## 2013-11-09 MED ORDER — HYDROMORPHONE HCL 1 MG/ML IJ SOLN
0.5000 mg | INTRAMUSCULAR | Status: DC | PRN
  Administered 2013-11-09 – 2013-11-10 (×7): 1 mg via INTRAVENOUS
  Administered 2013-11-10: 0.5 mg via INTRAVENOUS
  Administered 2013-11-11 (×4): 1 mg via INTRAVENOUS
  Filled 2013-11-09 (×11): qty 1

## 2013-11-09 MED ORDER — OXYCODONE HCL 5 MG PO TABS
ORAL_TABLET | ORAL | Status: AC
  Administered 2013-11-09: 10 mg via ORAL
  Filled 2013-11-09: qty 2

## 2013-11-09 MED ORDER — LIDOCAINE HCL (CARDIAC) 20 MG/ML IV SOLN
INTRAVENOUS | Status: AC
  Filled 2013-11-09: qty 5

## 2013-11-09 MED ORDER — HYDROCODONE-ACETAMINOPHEN 5-325 MG PO TABS
ORAL_TABLET | ORAL | Status: AC
  Administered 2013-11-09: 1 via ORAL
  Filled 2013-11-09: qty 1

## 2013-11-09 MED ORDER — DOCUSATE SODIUM 100 MG PO CAPS
100.0000 mg | ORAL_CAPSULE | Freq: Two times a day (BID) | ORAL | Status: DC
  Administered 2013-11-09 – 2013-11-11 (×3): 100 mg via ORAL
  Filled 2013-11-09 (×5): qty 1

## 2013-11-09 MED ORDER — MIDAZOLAM HCL 2 MG/2ML IJ SOLN
INTRAMUSCULAR | Status: AC
  Filled 2013-11-09: qty 2

## 2013-11-09 MED ORDER — DIPHENHYDRAMINE HCL 12.5 MG/5ML PO ELIX
12.5000 mg | ORAL_SOLUTION | ORAL | Status: DC | PRN
  Administered 2013-11-09 – 2013-11-11 (×6): 25 mg via ORAL
  Filled 2013-11-09 (×7): qty 10

## 2013-11-09 MED ORDER — GLYCOPYRROLATE 0.2 MG/ML IJ SOLN
INTRAMUSCULAR | Status: AC
  Filled 2013-11-09: qty 1

## 2013-11-09 MED ORDER — DEXTROSE 5 % IV SOLN: 500.0000 mg | Freq: Four times a day (QID) | INTRAVENOUS | Status: DC | PRN

## 2013-11-09 MED ORDER — ONDANSETRON HCL 4 MG/2ML IJ SOLN: 4.0000 mg | Freq: Once | INTRAMUSCULAR | Status: DC | PRN

## 2013-11-09 MED ORDER — SODIUM CHLORIDE 0.9 % IR SOLN
Status: DC | PRN
  Administered 2013-11-09: 1000 mL

## 2013-11-09 MED ORDER — FENTANYL CITRATE 0.05 MG/ML IJ SOLN
25.0000 ug | INTRAMUSCULAR | Status: DC | PRN
  Administered 2013-11-09: 25 ug via INTRAVENOUS
  Administered 2013-11-09: 50 ug via INTRAVENOUS
  Administered 2013-11-09: 25 ug via INTRAVENOUS
  Administered 2013-11-09: 50 ug via INTRAVENOUS

## 2013-11-09 MED ORDER — HYDROMORPHONE HCL 1 MG/ML IJ SOLN
INTRAMUSCULAR | Status: AC
  Administered 2013-11-09: 1 mg via INTRAVENOUS
  Filled 2013-11-09: qty 1

## 2013-11-09 MED ORDER — METHOCARBAMOL 500 MG PO TABS
ORAL_TABLET | ORAL | Status: AC
  Administered 2013-11-09: 500 mg via ORAL
  Filled 2013-11-09: qty 1

## 2013-11-09 MED ORDER — TRANEXAMIC ACID 100 MG/ML IV SOLN
2000.0000 mg | INTRAVENOUS | Status: AC
  Administered 2013-11-09: 2000 mg via TOPICAL
  Filled 2013-11-09: qty 20

## 2013-11-09 MED ORDER — LACTATED RINGERS IV SOLN
INTRAVENOUS | Status: DC
  Administered 2013-11-09: 20:00:00 via INTRAVENOUS

## 2013-11-09 MED ORDER — METHOCARBAMOL 500 MG PO TABS
500.0000 mg | ORAL_TABLET | Freq: Four times a day (QID) | ORAL | Status: DC | PRN
Start: 2013-11-09 — End: 2013-11-11
  Administered 2013-11-09 – 2013-11-11 (×9): 500 mg via ORAL
  Filled 2013-11-09 (×8): qty 1

## 2013-11-09 MED ORDER — LIDOCAINE HCL (CARDIAC) 20 MG/ML IV SOLN
INTRAVENOUS | Status: DC | PRN
  Administered 2013-11-09: 30 mg via INTRAVENOUS

## 2013-11-09 MED ORDER — PHENOL 1.4 % MT LIQD: 1.0000 | OROMUCOSAL | Status: DC | PRN

## 2013-11-09 MED ORDER — METOCLOPRAMIDE HCL 10 MG PO TABS: 5.0000 mg | ORAL_TABLET | Freq: Three times a day (TID) | ORAL | Status: DC | PRN

## 2013-11-09 MED ORDER — FENTANYL CITRATE 0.05 MG/ML IJ SOLN
INTRAMUSCULAR | Status: DC | PRN
  Administered 2013-11-09 (×2): 100 ug via INTRAVENOUS
  Administered 2013-11-09: 50 ug via INTRAVENOUS

## 2013-11-09 MED ORDER — ONDANSETRON HCL 4 MG PO TABS: 4.0000 mg | ORAL_TABLET | Freq: Four times a day (QID) | ORAL | Status: DC | PRN

## 2013-11-09 MED ORDER — BISACODYL 5 MG PO TBEC: 5.0000 mg | DELAYED_RELEASE_TABLET | Freq: Every day | ORAL | Status: DC | PRN

## 2013-11-09 MED ORDER — ONDANSETRON HCL 4 MG/2ML IJ SOLN: 4.0000 mg | Freq: Four times a day (QID) | INTRAMUSCULAR | Status: DC | PRN

## 2013-11-09 MED ORDER — OXYCODONE HCL 5 MG PO TABS
5.0000 mg | ORAL_TABLET | Freq: Four times a day (QID) | ORAL | Status: DC | PRN
  Administered 2013-11-09 – 2013-11-11 (×4): 10 mg via ORAL
  Filled 2013-11-09 (×4): qty 2

## 2013-11-09 MED ORDER — ACETAMINOPHEN 650 MG RE SUPP: 650.0000 mg | Freq: Four times a day (QID) | RECTAL | Status: DC | PRN

## 2013-11-09 MED ORDER — FENTANYL CITRATE 0.05 MG/ML IJ SOLN
INTRAMUSCULAR | Status: AC
  Administered 2013-11-09: 25 ug via INTRAVENOUS
  Filled 2013-11-09: qty 2

## 2013-11-09 MED ORDER — PROPOFOL 10 MG/ML IV BOLUS
INTRAVENOUS | Status: AC
  Filled 2013-11-09: qty 20

## 2013-11-09 MED ORDER — CEFAZOLIN SODIUM-DEXTROSE 2-3 GM-% IV SOLR
2.0000 g | Freq: Four times a day (QID) | INTRAVENOUS | Status: AC
  Administered 2013-11-09 (×2): 2 g via INTRAVENOUS
  Filled 2013-11-09 (×2): qty 50

## 2013-11-09 MED ORDER — FENTANYL CITRATE 0.05 MG/ML IJ SOLN
INTRAMUSCULAR | Status: AC
  Administered 2013-11-09: 50 ug via INTRAVENOUS
  Filled 2013-11-09: qty 2

## 2013-11-09 MED ORDER — FENTANYL CITRATE 0.05 MG/ML IJ SOLN
INTRAMUSCULAR | Status: AC
  Filled 2013-11-09: qty 5

## 2013-11-09 MED ORDER — ONDANSETRON HCL 4 MG/2ML IJ SOLN
INTRAMUSCULAR | Status: DC | PRN
  Administered 2013-11-09: 4 mg via INTRAVENOUS

## 2013-11-09 MED ORDER — SODIUM CHLORIDE 0.9 % IJ SOLN
INTRAMUSCULAR | Status: AC
  Filled 2013-11-09: qty 10

## 2013-11-09 MED ORDER — HYDROCODONE-ACETAMINOPHEN 5-325 MG PO TABS
1.0000 | ORAL_TABLET | ORAL | Status: DC | PRN
  Administered 2013-11-09 (×2): 1 via ORAL
  Administered 2013-11-10 – 2013-11-11 (×4): 2 via ORAL
  Filled 2013-11-09 (×4): qty 2

## 2013-11-09 MED ORDER — ALUM & MAG HYDROXIDE-SIMETH 200-200-20 MG/5ML PO SUSP: 30.0000 mL | ORAL | Status: DC | PRN

## 2013-11-09 MED ORDER — MENTHOL 3 MG MT LOZG: 1.0000 | LOZENGE | OROMUCOSAL | Status: DC | PRN

## 2013-11-09 MED ORDER — BUPIVACAINE LIPOSOME 1.3 % IJ SUSP
20.0000 mL | Freq: Once | INTRAMUSCULAR | Status: AC
  Administered 2013-11-09: 20 mL
  Filled 2013-11-09: qty 20

## 2013-11-09 MED ORDER — MIDAZOLAM HCL 5 MG/5ML IJ SOLN
INTRAMUSCULAR | Status: DC | PRN
  Administered 2013-11-09 (×2): 2 mg via INTRAVENOUS

## 2013-11-09 MED ORDER — ASPIRIN EC 325 MG PO TBEC
325.0000 mg | DELAYED_RELEASE_TABLET | Freq: Two times a day (BID) | ORAL | Status: DC
  Administered 2013-11-10 – 2013-11-11 (×3): 325 mg via ORAL
  Filled 2013-11-09 (×5): qty 1

## 2013-11-09 MED ORDER — EPHEDRINE SULFATE 50 MG/ML IJ SOLN
INTRAMUSCULAR | Status: AC
  Filled 2013-11-09: qty 1

## 2013-11-09 MED ORDER — METOCLOPRAMIDE HCL 5 MG/ML IJ SOLN: 5.0000 mg | Freq: Three times a day (TID) | INTRAMUSCULAR | Status: DC | PRN

## 2013-11-09 SURGICAL SUPPLY — 77 items
APL SKNCLS STERI-STRIP NONHPOA (GAUZE/BANDAGES/DRESSINGS) ×1
BAG DECANTER FOR FLEXI CONT (MISCELLANEOUS) ×2 IMPLANT
BANDAGE ELASTIC 4 VELCRO ST LF (GAUZE/BANDAGES/DRESSINGS) ×3 IMPLANT
BANDAGE ESMARK 6X9 LF (GAUZE/BANDAGES/DRESSINGS) ×1 IMPLANT
BENZOIN TINCTURE PRP APPL 2/3 (GAUZE/BANDAGES/DRESSINGS) ×2 IMPLANT
BLADE SAG 18X100X1.27 (BLADE) ×2 IMPLANT
BLADE SAGITTAL 13X1.27X60 (BLADE) ×1 IMPLANT
BLADE SAGITTAL 13X1.27X60MM (BLADE) ×1
BLADE SAGITTAL 25.0X1.19X90 (BLADE) IMPLANT
BLADE SAGITTAL 25.0X1.19X90MM (BLADE)
BLADE SURG ROTATE 9660 (MISCELLANEOUS) IMPLANT
BNDG CMPR 9X6 STRL LF SNTH (GAUZE/BANDAGES/DRESSINGS) ×1
BNDG CMPR MED 10X6 ELC LF (GAUZE/BANDAGES/DRESSINGS) ×1
BNDG ELASTIC 6X10 VLCR STRL LF (GAUZE/BANDAGES/DRESSINGS) ×3 IMPLANT
BNDG ESMARK 6X9 LF (GAUZE/BANDAGES/DRESSINGS) ×3
BNDG GAUZE ELAST 4 BULKY (GAUZE/BANDAGES/DRESSINGS) ×4 IMPLANT
BOWL SMART MIX CTS (DISPOSABLE) ×3 IMPLANT
CAP KNEE ATTUNE RP ×2 IMPLANT
CEMENT HV SMART SET (Cement) ×6 IMPLANT
CLOSURE WOUND 1/2 X4 (GAUZE/BANDAGES/DRESSINGS) ×1
COVER SURGICAL LIGHT HANDLE (MISCELLANEOUS) ×3 IMPLANT
CUFF TOURNIQUET SINGLE 34IN LL (TOURNIQUET CUFF) ×3 IMPLANT
CUFF TOURNIQUET SINGLE 44IN (TOURNIQUET CUFF) IMPLANT
DRAPE EXTREMITY T 121X128X90 (DRAPE) ×3 IMPLANT
DRAPE PROXIMA HALF (DRAPES) ×3 IMPLANT
DRAPE U-SHAPE 47X51 STRL (DRAPES) ×3 IMPLANT
DRSG ADAPTIC 3X8 NADH LF (GAUZE/BANDAGES/DRESSINGS) ×3 IMPLANT
DRSG PAD ABDOMINAL 8X10 ST (GAUZE/BANDAGES/DRESSINGS) ×3 IMPLANT
DURAPREP 26ML APPLICATOR (WOUND CARE) ×3 IMPLANT
ELECT REM PT RETURN 9FT ADLT (ELECTROSURGICAL) ×3
ELECTRODE REM PT RTRN 9FT ADLT (ELECTROSURGICAL) ×1 IMPLANT
GAUZE SPONGE 4X4 12PLY STRL (GAUZE/BANDAGES/DRESSINGS) ×3 IMPLANT
GLOVE BIO SURGEON STRL SZ7 (GLOVE) ×2 IMPLANT
GLOVE BIO SURGEON STRL SZ7.5 (GLOVE) ×2 IMPLANT
GLOVE BIO SURGEON STRL SZ8 (GLOVE) ×6 IMPLANT
GLOVE BIOGEL PI IND STRL 7.5 (GLOVE) IMPLANT
GLOVE BIOGEL PI IND STRL 8 (GLOVE) ×2 IMPLANT
GLOVE BIOGEL PI INDICATOR 7.5 (GLOVE) ×4
GLOVE BIOGEL PI INDICATOR 8 (GLOVE) ×4
GLOVE SURG SS PI 6.5 STRL IVOR (GLOVE) ×2 IMPLANT
GOWN STRL REUS W/ TWL LRG LVL3 (GOWN DISPOSABLE) ×1 IMPLANT
GOWN STRL REUS W/ TWL XL LVL3 (GOWN DISPOSABLE) ×2 IMPLANT
GOWN STRL REUS W/TWL LRG LVL3 (GOWN DISPOSABLE) ×6
GOWN STRL REUS W/TWL XL LVL3 (GOWN DISPOSABLE) ×6
HANDPIECE INTERPULSE COAX TIP (DISPOSABLE) ×3
HOOD PEEL AWAY FACE SHEILD DIS (HOOD) ×8 IMPLANT
IMMOBILIZER KNEE 20 (SOFTGOODS) IMPLANT
IMMOBILIZER KNEE 22 UNIV (SOFTGOODS) ×3 IMPLANT
IMMOBILIZER KNEE 24 THIGH 36 (MISCELLANEOUS) IMPLANT
IMMOBILIZER KNEE 24 UNIV (MISCELLANEOUS)
KIT BASIN OR (CUSTOM PROCEDURE TRAY) ×3 IMPLANT
KIT ROOM TURNOVER OR (KITS) ×3 IMPLANT
MANIFOLD NEPTUNE II (INSTRUMENTS) ×3 IMPLANT
NDL 18GX1X1/2 (RX/OR ONLY) (NEEDLE) IMPLANT
NDL HYPO 21X1 ECLIPSE (NEEDLE) ×1 IMPLANT
NDL SPNL 22GX3.5 QUINCKE BK (NEEDLE) IMPLANT
NEEDLE 18GX1X1/2 (RX/OR ONLY) (NEEDLE) ×3 IMPLANT
NEEDLE HYPO 21X1 ECLIPSE (NEEDLE) IMPLANT
NEEDLE SPNL 22GX3.5 QUINCKE BK (NEEDLE) ×3 IMPLANT
NS IRRIG 1000ML POUR BTL (IV SOLUTION) ×1 IMPLANT
PACK TOTAL JOINT (CUSTOM PROCEDURE TRAY) ×3 IMPLANT
PAD ARMBOARD 7.5X6 YLW CONV (MISCELLANEOUS) ×6 IMPLANT
SET HNDPC FAN SPRY TIP SCT (DISPOSABLE) ×1 IMPLANT
STAPLER VISISTAT 35W (STAPLE) IMPLANT
STRIP CLOSURE SKIN 1/2X4 (GAUZE/BANDAGES/DRESSINGS) ×1 IMPLANT
SUCTION FRAZIER TIP 10 FR DISP (SUCTIONS) ×2 IMPLANT
SUT MNCRL AB 3-0 PS2 18 (SUTURE) ×2 IMPLANT
SUT VIC AB 0 CT1 27 (SUTURE) ×6
SUT VIC AB 0 CT1 27XBRD ANBCTR (SUTURE) ×2 IMPLANT
SUT VIC AB 2-0 CT1 27 (SUTURE) ×6
SUT VIC AB 2-0 CT1 TAPERPNT 27 (SUTURE) ×2 IMPLANT
SUT VLOC 180 0 24IN GS25 (SUTURE) ×3 IMPLANT
SYR 50ML LL SCALE MARK (SYRINGE) ×1 IMPLANT
SYR CONTROL 10ML LL (SYRINGE) ×2 IMPLANT
TOWEL OR 17X24 6PK STRL BLUE (TOWEL DISPOSABLE) ×3 IMPLANT
TOWEL OR 17X26 10 PK STRL BLUE (TOWEL DISPOSABLE) ×3 IMPLANT
TRAY FOLEY CATH 14FR (SET/KITS/TRAYS/PACK) ×2 IMPLANT

## 2013-11-09 NOTE — Evaluation (Addendum)
Occupational Therapy Evaluation Patient Details Name: Lacey [name] MRN: [id number] DOB: 1956/01/08 Today's Date: 11/09/2013    History of Present Illness 58 y.o. s/p Rt TKA.   Clinical Impression   Pt s/p above. Session limited today due to pain, nausea, and dizziness. Nurse notified of pain. Feel pt will benefit from acute OT to increase independence prior to d/c. Plan to practice LB ADLs next session.     Follow Up Recommendations  No OT follow up;Supervision/Assistance - 24 hour    Equipment Recommendations  3 in 1 bedside comode;Other (comment) (RW)    Recommendations for Other Services       Precautions / Restrictions Precautions Precautions: Knee Precaution Comments: educated on precautions Required Braces or Orthoses: Knee Immobilizer - Right Restrictions Weight Bearing Restrictions: Yes RLE Weight Bearing: Weight bearing as tolerated      Mobility Bed Mobility Overal bed mobility: Needs Assistance Bed Mobility: Supine to Sit;Sit to Supine     Supine to sit: Min assist Sit to supine: Min assist   General bed mobility comments: assist with RLE  Transfers Overall transfer level: Needs assistance Equipment used: Rolling walker (2 wheeled) Transfers: Sit to/from Stand Sit to Stand: Min guard              Balance                                            ADL Overall ADL's : Needs assistance/impaired     Grooming: Set up;Bed level               Lower Body Dressing: Maximal assistance;Sit to/from stand   Toilet Transfer: Min guard;RW (sit to stand from bed)             General ADL Comments: Limited due to pain, dizziness, nausea. Educated on precautions. Educated on dressing technique. Educated on safety tips (rugs, use of bag on walker). Husband and pt's mother present     Vision                     Perception     Praxis      Pertinent Vitals/Pain Pain Assessment: 0-10 Pain Score:  (12) Pain  Location: Rt knee Pain Descriptors / Indicators: Crying Pain Intervention(s): Limited activity within patient's tolerance;Monitored during session;Other (comment) (notified nurse)     Hand Dominance     Extremity/Trunk Assessment Upper Extremity Assessment Upper Extremity Assessment: Overall WFL for tasks assessed   Lower Extremity Assessment Lower Extremity Assessment: Defer to PT evaluation       Communication Communication Communication: No difficulties   Cognition Arousal/Alertness: Awake/alert Behavior During Therapy: WFL for tasks assessed/performed Overall Cognitive Status: Within Functional Limits for tasks assessed                     General Comments       Exercises       Shoulder Instructions      Home Living Family/patient expects to be discharged to:: Private residence Living Arrangements: Spouse/significant other;Children Available Help at Discharge: Family (mom staying until Friday) Type of Home: House Home Access: Stairs to enter [employer] of Steps: 2 Entrance Stairs-Rails: Right;Left Home Layout: One level     Bathroom Shower/Tub: Tub/shower unit;Walk-in shower   Bathroom Toilet: Standard  Prior Functioning/Environment Level of Independence: Independent             OT Diagnosis: Acute pain   OT Problem List: Decreased strength;Decreased range of motion;Decreased activity tolerance;Decreased knowledge of precautions;Decreased knowledge of use of DME or AE;Pain   OT Treatment/Interventions: Self-care/ADL training;DME and/or AE instruction;Therapeutic activities;Patient/family education;Balance training    OT Goals(Current goals can be found in the care plan section) Acute Rehab OT Goals Patient Stated Goal: not stated OT Goal Formulation: With patient/family Time For Goal Achievement: 11/16/13 Potential to Achieve Goals: Good ADL Goals Pt Will Perform Lower Body Bathing: with modified  independence;sit to/from stand Pt Will Perform Lower Body Dressing: with modified independence;sit to/from stand Pt Will Transfer to Toilet: with modified independence;ambulating Pt Will Perform Toileting - Clothing Manipulation and hygiene: with modified independence;sit to/from stand Pt Will Perform Tub/Shower Transfer: Tub transfer;Shower transfer;with supervision;ambulating;rolling walker;3 in 1  OT Frequency: Min 2X/week   Barriers to D/C:            Co-evaluation              End of Session Equipment Utilized During Treatment: Rolling walker;Right knee immobilizer;Gait belt;Oxygen CPM Right Knee CPM Right Knee: Off Nurse Communication: Other (comment) (pain)  Activity Tolerance: Patient limited by pain;Other (comment) (dizziness, nausea) Patient left: in bed;with call bell/phone within reach;with family/visitor present   Time: 8032-1224 OT Time Calculation (min): 15 min Charges:  OT General Charges $OT Visit: 1 Procedure OT Evaluation $Initial OT Evaluation Tier I: 1 Procedure G-CodesBenito Mccreedy OTR/L [phone] 11/09/2013, 4:09 PM

## 2013-11-09 NOTE — Op Note (Signed)
PREOP DIAGNOSIS: DJD RIGHT KNEE POSTOP DIAGNOSIS: same PROCEDURE: RIGHT TKR ANESTHESIA: Spinal and block ATTENDING SURGEON: Gwendolen Hewlett G ASSISTANT: Loni Dolly PA  INDICATIONS FOR PROCEDURE: Lacey [name] is a 58 y.o. female who has struggled for a long time with pain due to degenerative arthritis of the right knee.  The patient has failed many conservative non-operative measures and at this point has pain which limits the ability to sleep and walk.  The patient is offered total knee replacement.  Informed operative consent was obtained after discussion of possible risks of anesthesia, infection, neurovascular injury, DVT, and death.  The importance of the post-operative rehabilitation protocol to optimize result was stressed extensively with the patient.  SUMMARY OF FINDINGS AND PROCEDURE:  Lacey [name] was taken to the operative suite where under the above anesthesia a right knee replacement was performed.  There were advanced degenerative changes and the bone quality was excellent.  We used the DePuy Attune system and placed size 7 femur, 7 tibia, 38 mm all polyethylene patella, and a size 10 mm spacer.  Loni Dolly PA-C assisted throughout and was invaluable to the completion of the case in that he helped retract and maintain exposure while I placed components.  He also helped close thereby minimizing OR time.  The patient was admitted for appropriate post-op care to include perioperative antibiotics and mechanical and pharmacologic measures for DVT prophylaxis.  DESCRIPTION OF PROCEDURE:  Lacey [name] was taken to the operative suite where the above anesthesia was applied.  The patient was positioned supine and prepped and draped in normal sterile fashion.  An appropriate time out was performed.  After the administration of kefzol pre-op antibiotic the leg was elevated and exsanguinated and a tourniquet inflated. A standard longitudinal incision was made on the anterior knee.  Dissection  was carried down to the extensor mechanism.  All appropriate anti-infective measures were used including the pre-operative antibiotic, betadine impregnated drape, and closed hooded exhaust systems for each member of the surgical team.  A medial parapatellar incision was made in the extensor mechanism and the knee cap flipped and the knee flexed.  Some residual meniscal tissues were removed along with any remaining ACL/PCL tissue.  A guide was placed on the tibia and a flat cut was made on it's superior surface.  An intramedullary guide was placed in the femur and was utilized to make anterior and posterior cuts creating an appropriate flexion gap.  A second intramedullary guide was placed in the femur to make a distal cut properly balancing the knee with an extension gap equal to the flexion gap.  The three bones sized to the above mentioned sizes and the appropriate guides were placed and utilized.  A trial reduction was done and the knee easily came to full extension and the patella tracked well on flexion.  The trial components were removed and all bones were cleaned with pulsatile lavage and then dried thoroughly.  Cement was mixed and was pressurized onto the bones followed by placement of the aforementioned components.  Excess cement was trimmed and pressure was held on the components until the cement had hardened.  The tourniquet was deflated and a small amount of bleeding was controlled with cautery and pressure.  The knee was irrigated thoroughly.  The extensor mechanism was re-approximated with V-loc suture in running fashion.  The knee was flexed and the repair was solid.  The subcutaneous tissues were re-approximated with #0 and #2-0 vicryl and the skin closed with a  subcuticular stitch and steristrips.  A sterile dressing was applied.  Intraoperative fluids, EBL, and tourniquet time can be obtained from anesthesia records.  DISPOSITION:  The patient was taken to recovery room in stable condition and  admitted for appropriate post-op care to include peri-operative antibiotic and DVT prophylaxis with mechanical and pharmacologic measures.  Eleftherios Dudenhoeffer G 11/09/2013, 9:23 AM

## 2013-11-09 NOTE — Progress Notes (Signed)
Orthopedic Tech Progress Note Patient Details:  Lacey [name] [facility] 1955/03/10 [id number] Applied CPM to RLE.  Applied OHF with trapeze to pt.'s bed. CPM Right Knee CPM Right Knee: On Right Knee Flexion (Degrees): 90 Right Knee Extension (Degrees): 0   Darrol Poke 11/09/2013, 11:27 AM

## 2013-11-09 NOTE — Anesthesia Preprocedure Evaluation (Signed)
Anesthesia Evaluation  Patient identified by MRN, date of birth, ID band Patient awake    Reviewed: Allergy & Precautions, H&P , NPO status , Patient's Chart, lab work & pertinent test results  Airway Mallampati: I  TM Distance: >3 FB Neck ROM: Full    Dental  (+) Teeth Intact   Pulmonary  breath sounds clear to auscultation        Cardiovascular Rhythm:Regular Rate:Normal     Neuro/Psych    GI/Hepatic   Endo/Other    Renal/GU      Musculoskeletal   Abdominal   Peds  Hematology   Anesthesia Other Findings   Reproductive/Obstetrics                             Anesthesia Physical Anesthesia Plan  ASA: I  Anesthesia Plan: Spinal and MAC   Post-op Pain Management:    Induction:   Airway Management Planned: Natural Airway and Simple Face Mask  Additional Equipment:   Intra-op Plan:   Post-operative Plan:   Informed Consent: I have reviewed the patients History and Physical, chart, labs and discussed the procedure including the risks, benefits and alternatives for the proposed anesthesia with the patient or authorized representative who has indicated his/her understanding and acceptance.     Plan Discussed with: CRNA and Anesthesiologist  Anesthesia Plan Comments: (R. Knee OA  Plan spinal anesthesia with adductor canal block  Roberts Gaudy)        Anesthesia Quick Evaluation

## 2013-11-09 NOTE — Progress Notes (Signed)
Orthopedic Tech Progress Note Patient Details:  Lacey [name] [facility] 1955/05/07 [id number] On cpm at 9:45 pm Patient ID: Lacey [name], female   DOB: August 18, 1955, 58 y.o.   MRN: [id number]   Lacey [name] 11/09/2013, 9:44 PM

## 2013-11-09 NOTE — Progress Notes (Signed)
Utilization review completed.  

## 2013-11-09 NOTE — Anesthesia Postprocedure Evaluation (Signed)
  Anesthesia Post-op Note  Patient: Lacey [name]  Procedure(s) Performed: Procedure(s): TOTAL KNEE ARTHROPLASTY (Right)  Patient Location: PACU  Anesthesia Type: Spinal/MAC  Level of Consciousness: awake and alert   Airway and Oxygen Therapy: Patient Spontanous Breathing  Post-op Pain: mild  Post-op Assessment: Post-op Vital signs reviewed, Patient's Cardiovascular Status Stable and Respiratory Function Stable. No residual motor block.  Post-op Vital Signs: Reviewed  Filed Vitals:   11/09/13 1415  BP: 117/77  Pulse: 63  Temp: 36.7 C  Resp: 15    Complications: No apparent anesthesia complications

## 2013-11-09 NOTE — Interval H&P Note (Signed)
History and Physical Interval Note:  11/09/2013 7:26 AM  Lacey [name]  has presented today for surgery, with the diagnosis of RIGHT KNEE DEGENERATIVE JOINT DISEASE  The various methods of treatment have been discussed with the patient and family. After consideration of risks, benefits and other options for treatment, the patient has consented to  Procedure(s): TOTAL KNEE ARTHROPLASTY (Right) as a surgical intervention .  The patient's history has been reviewed, patient examined, no change in status, stable for surgery.  I have reviewed the patient's chart and labs.  Questions were answered to the patient's satisfaction.     Viviana Trimble G

## 2013-11-09 NOTE — Evaluation (Signed)
Physical Therapy Evaluation Patient Details Name: JAIDON SPONSEL MRN: [id number] DOB: [birth year] Today's Date: 11/09/2013   History of Present Illness  58 y.o. s/p Rt TKA. WBAT RLE.  Clinical Impression  Patient presents with post surgical deficits of RLE s/p surgery and pain limiting safe mobility. Tolerated short distance ambulation with use of RW for support. Education provided on exercises to perform during the day and reviewed precautions. Pt would benefit from acute PT and HHPT to improve transfers, gait, balance and mobility so pt can maximize independence and return to PLOF.    Follow Up Recommendations Home health PT;Supervision/Assistance - 24 hour    Equipment Recommendations  Rolling walker with 5" wheels    Recommendations for Other Services       Precautions / Restrictions Precautions Precautions: Knee Precaution Comments: educated on precautions Required Braces or Orthoses: Knee Immobilizer - Right Restrictions Weight Bearing Restrictions: Yes RLE Weight Bearing: Weight bearing as tolerated      Mobility  Bed Mobility Overal bed mobility: Needs Assistance Bed Mobility: Supine to Sit;Sit to Supine     Supine to sit: Min guard Sit to supine: Min guard   General bed mobility comments: Pt used UEs to mobilize RLE to EOB. Min guard for safety. HOB elevated, use of rails.  Transfers Overall transfer level: Needs assistance Equipment used: Rolling walker (2 wheeled) Transfers: Sit to/from [facility] Sit to Stand: Min guard Stand pivot transfers: Min guard       General transfer comment: Min guard to stand for safety. SPT onto toilet.  Ambulation/Gait Ambulation/Gait assistance: Min guard Ambulation Distance (Feet): 20 Feet (+20') Assistive device: Rolling walker (2 wheeled) Gait Pattern/deviations: Step-to pattern;Decreased stride length;Decreased stance time - right;Decreased step length - left Gait velocity: decreased   General  Gait Details: Mild balance deficits noted during gait requiring Min guard for safety. Pt mildly impulsive during mobility. No overt LOB.   Stairs            Wheelchair Mobility    Modified Rankin (Stroke Patients Only)       Balance Overall balance assessment: Needs assistance Sitting-balance support: Feet supported Sitting balance-Leahy Scale: Good Sitting balance - Comments: Able to weight shift and reach outside BoS without difficulty. Independent with peri care.   Standing balance support: During functional activity Standing balance-Leahy Scale: Fair Standing balance comment: Able to perform dynamic standing fixing blankets and sheets without UE support however requires UE support during gait training for balance/safety.                             Pertinent Vitals/Pain Pain Assessment: 0-10 Pain Score: 9  Pain Location: right knee Pain Descriptors / Indicators: Aching;Sore Pain Intervention(s): Limited activity within patient's tolerance;Monitored during session;Repositioned;Premedicated before session;Ice applied    Home Living Family/patient expects to be discharged to:: Private residence Living Arrangements: Spouse/significant other;Children Available Help at Discharge: Family Type of Home: House Home Access: Stairs to enter Entrance Stairs-Rails: Right Entrance Stairs-Number of Steps: 2 Home Layout: One level        Prior Function Level of Independence: Independent               Hand Dominance        Extremity/Trunk Assessment   Upper Extremity Assessment: Overall WFL for tasks assessed           Lower Extremity Assessment: RLE deficits/detail RLE Deficits / Details: Limited AROM knee flexion/extension, hip flexion and  ankle AROM WFL. Strength functional as pt tolerated standing without knee buckling.    Cervical / Trunk Assessment: Normal  Communication   Communication: No difficulties  Cognition Arousal/Alertness:  Awake/alert Behavior During Therapy: WFL for tasks assessed/performed;Impulsive Overall Cognitive Status: Within Functional Limits for tasks assessed                      General Comments General comments (skin integrity, edema, etc.): Surgical site not visible due to post surgical wrap.    Exercises Total Joint Exercises Ankle Circles/Pumps: AROM;Both;15 reps;Supine Quad Sets: Both;10 reps;Supine Gluteal Sets: Both;10 reps      Assessment/Plan    PT Assessment Patient needs continued PT services  PT Diagnosis Difficulty walking;Acute pain;Generalized weakness   PT Problem List Pain;Decreased strength;Decreased range of motion;Decreased activity tolerance;Decreased balance;Decreased safety awareness;Decreased mobility  PT Treatment Interventions Balance training;Gait training;Stair training;Functional mobility training;Patient/family education;Therapeutic activities;Therapeutic exercise   PT Goals (Current goals can be found in the Care Plan section) Acute Rehab PT Goals Patient Stated Goal: to get up and moving PT Goal Formulation: With patient Time For Goal Achievement: 11/23/13 Potential to Achieve Goals: Good    Frequency 7X/week   Barriers to discharge        Co-evaluation               End of Session Equipment Utilized During Treatment: Gait belt Activity Tolerance: Patient tolerated treatment well Patient left: in bed;with call bell/phone within reach;with bed alarm set;with family/visitor present Nurse Communication: Mobility status;Weight bearing status;Precautions         Time: 3295-1884 PT Time Calculation (min): 26 min   Charges:   PT Evaluation $Initial PT Evaluation Tier I: 1 Procedure PT Treatments $Gait Training: 8-22 mins   PT G CodesCandy Sledge A 11/09/2013, 4:56 PM Candy Sledge, PT, DPT [phone]

## 2013-11-09 NOTE — Transfer of Care (Signed)
Immediate Anesthesia Transfer of Care Note  Patient: Lacey [name]  Procedure(s) Performed: Procedure(s): TOTAL KNEE ARTHROPLASTY (Right)  Patient Location: PACU  Anesthesia Type:MAC and Spinal  Level of Consciousness: awake, alert  and oriented  Airway & Oxygen Therapy: Patient Spontanous Breathing  Post-op Assessment: Report given to PACU RN and Post -op Vital signs reviewed and stable  Post vital signs: Reviewed and stable  Complications: No apparent anesthesia complications

## 2013-11-09 NOTE — Anesthesia Procedure Notes (Addendum)
Spinal  Start time: 11/09/2013 7:40 AM End time: 11/09/2013 7:45 AM Preanesthetic Checklist Completed: patient identified, site marked, surgical consent, pre-op evaluation, timeout performed, IV checked, risks and benefits discussed and monitors and equipment checked Spinal Block Patient position: right lateral decubitus Prep: Betadine Patient monitoring: heart rate, cardiac monitor, continuous pulse ox and blood pressure Approach: right paramedian Location: L4-5 Injection technique: single-shot Needle Needle gauge: 22 G Needle length: 9 cm Needle insertion depth: 5 cm Assessment Sensory level: T8 Additional Notes 0.75% marcaine 7.5 mg injected easily, clear CSF  Roberts Gaudy  Anesthesia Regional Block:  Adductor canal block  Pre-Anesthetic Checklist: ,, timeout performed, Correct Patient, Correct Site, Correct Laterality, Correct Procedure, Correct Position, site marked, Risks and benefits discussed,  Surgical consent,  Pre-op evaluation,  At surgeon's request and post-op pain management  Laterality: Right  Prep: Betadine       Needles:  Injection technique: Single-shot  Needle Type: Echogenic Stimulator Needle     Needle Length: 9cm 9 cm Needle Gauge: 22 and 22 G    Additional Needles:  Procedures: ultrasound guided (picture in chart) Adductor canal block Narrative:  Start time: 11/09/2013 7:10 AM End time: 11/09/2013 7:15 AM Injection made incrementally with aspirations every 5 mL.  Performed by: Personally   Additional Notes: 25 cc 0.5% marcaine with 1:200 Epi injected easily  Roberts Gaudy

## 2013-11-10 LAB — BASIC METABOLIC PANEL
ANION GAP: 11 (ref 5–15)
BUN: 7 mg/dL (ref 6–23)
CO2: 27 mEq/L (ref 19–32)
CREATININE: 0.77 mg/dL (ref 0.50–1.10)
Calcium: 9.1 mg/dL (ref 8.4–10.5)
Chloride: 98 mEq/L (ref 96–112)
GFR calc Af Amer: >90 mL/min (ref >90)
Glucose, Bld: 151 mg/dL — ABNORMAL HIGH (ref 70–99)
POTASSIUM: 4 meq/L (ref 3.7–5.3)
Sodium: 136 mEq/L — ABNORMAL LOW (ref 137–147)

## 2013-11-10 LAB — CBC
HEMATOCRIT: 32.7 % — AB (ref 36.0–46.0)
HEMOGLOBIN: 11.2 g/dL — AB (ref 12.0–15.0)
MCH: 29 pg (ref 26.0–34.0)
MCHC: 34.3 g/dL (ref 30.0–36.0)
MCV: 84.7 fL (ref 78.0–100.0)
Platelets: 231 10*3/uL (ref 150–400)
RBC: 3.86 MIL/uL — ABNORMAL LOW (ref 3.87–5.11)
RDW: 13.4 % (ref 11.5–15.5)
WBC: 7.4 10*3/uL (ref 4.0–10.5)

## 2013-11-10 MED ORDER — ALPRAZOLAM 0.5 MG PO TABS
1.0000 mg | ORAL_TABLET | Freq: Two times a day (BID) | ORAL | Status: AC
  Administered 2013-11-10 (×2): 1 mg via ORAL
  Filled 2013-11-10 (×2): qty 2

## 2013-11-10 MED ORDER — HYDROCODONE-ACETAMINOPHEN 10-325 MG PO TABS
2.0000 | ORAL_TABLET | ORAL | Status: DC | PRN
Start: 2013-11-10 — End: 2013-11-11
  Administered 2013-11-11: 2 via ORAL
  Filled 2013-11-10: qty 12

## 2013-11-10 NOTE — Progress Notes (Signed)
Subjective: 1 Day Post-Op Procedure(s) (LRB): TOTAL KNEE ARTHROPLASTY (Right)  Activity level:  wbat Diet tolerance:  ok Voiding:  ok Patient reports pain as mild and moderate.    Objective: Vital signs in last 24 hours: Temp:  [96.8 F (36 C)-98.2 F (36.8 C)] 97.8 F (36.6 C) (10/28 0552) Pulse Rate:  [50-80] 77 (10/28 0552) Resp:  [11-22] 17 (10/28 0552) BP: (107-134)/(68-87) 134/80 mmHg (10/28 0552) SpO2:  [95 %-100 %] 99 % (10/28 0552)  Labs:  Recent Labs  11/10/13 0545  HGB 11.2*    Recent Labs  11/10/13 0545  WBC 7.4  RBC 3.86*  HCT 32.7*  PLT 231   No results found for this basename: NA, K, CL, CO2, BUN, CREATININE, GLUCOSE, CALCIUM,  in the last 72 hours No results found for this basename: LABPT, INR,  in the last 72 hours  Physical Exam:  Neurologically intact ABD soft Neurovascular intact Sensation intact distally Intact pulses distally Dorsiflexion/Plantar flexion intact Incision: dressing C/D/I No cellulitis present Compartment soft  Assessment/Plan:  1 Day Post-Op Procedure(s) (LRB): TOTAL KNEE ARTHROPLASTY (Right) Advance diet Up with therapy D/C IV fluids Plan for discharge tomorrow Discharge home with home health if doing well and cleared by PT. Dressing changed to Aquacel.  Continue on ASA 325mg  BID x 2 weeks post op.  Follow up in office 2 weeks post op.     Shelbylynn Walczyk, Larwance Sachs 11/10/2013, 8:18 AM

## 2013-11-10 NOTE — Progress Notes (Signed)
Orthopedic Tech Progress Note Patient Details:  [name] [facility] August 04, 1955 [id number]  Patient ID: [name], female   DOB: [birth year], 58 y.o.   MRN: [id number] Placed pt's rle in cpm @ 0-50 degrees 2005  Gustavo Dispenza 11/10/2013, 8:02 PM

## 2013-11-10 NOTE — Progress Notes (Signed)
Physical Therapy Treatment Patient Details Name: Lacey [name] MRN: [id number] DOB: 12/22/1955 Today's Date: 11/10/2013    History of Present Illness 58 y.o. s/p Rt TKA. WBAT RLE.    PT Comments    Pt greatly limited due to pain and lethargy. Pt premedicated and keeping eyes closed during majority of session but responds and participates. Cont to follow per POC and hopeful for d/C home tomorrow if pain is controlled.   Follow Up Recommendations  Home health PT;Supervision/Assistance - 24 hour     Equipment Recommendations  Rolling walker with 5" wheels    Recommendations for Other Services       Precautions / Restrictions Precautions Precautions: Knee Precaution Comments: pt attempting to place ice bag under Rt knee; educated on no pillow/bend in knee with resting Required Braces or Orthoses: Knee Immobilizer - Right Knee Immobilizer - Right: On when out of bed or walking Restrictions Weight Bearing Restrictions: Yes RLE Weight Bearing: Weight bearing as tolerated    Mobility  Bed Mobility Overal bed mobility: Needs Assistance Bed Mobility: Supine to Sit     Supine to sit: Min guard;HOB elevated     General bed mobility comments: pt relying heavily on handrails and HOB elevated; min guard to steady Rt LE down to floor   Transfers Overall transfer level: Needs assistance Equipment used: Rolling walker (2 wheeled) Transfers: Sit to/from Stand Sit to Stand: Min guard         General transfer comment: min guard to steady with sit to stand from bed and from toilet; pt requires incr timem with all transfers secondary to pain   Ambulation/Gait Ambulation/Gait assistance: Min guard Ambulation Distance (Feet): 60 Feet Assistive device: Rolling walker (2 wheeled) Gait Pattern/deviations: Step-to pattern;Decreased stance time - right;Decreased step length - left;Antalgic;Narrow base of support;Trunk flexed Gait velocity: decreased Gait velocity interpretation: <1.8  ft/sec, indicative of risk for recurrent falls General Gait Details: pt greatly limited by pain and balanec deficits noted due to lethargy; pt c/o 11/10 pain but with eyes closed during gt; max cues for safety and to stay on task    Stairs            Wheelchair Mobility    Modified Rankin (Stroke Patients Only)       Balance Overall balance assessment: Needs assistance Sitting-balance support: Feet supported;No upper extremity supported Sitting balance-Leahy Scale: Good Sitting balance - Comments: denied any dizziness sitting EOB   Standing balance support: During functional activity;Bilateral upper extremity supported Standing balance-Leahy Scale: Poor Standing balance comment: relying heavily on RW for balance and UE support at all times                     Cognition Arousal/Alertness: Lethargic;Suspect due to medications Behavior During Therapy: Flat affect Overall Cognitive Status: Within Functional Limits for tasks assessed       Memory: Decreased recall of precautions              Exercises Total Joint Exercises Ankle Circles/Pumps: AROM;Both;15 reps;Supine Quad Sets: AROM;Strengthening;Right;10 reps;Seated Heel Slides: AAROM;Right;5 reps;Seated;Limitations Heel Slides Limitations: pain Hip ABduction/ADduction: AAROM;Strengthening;Right;10 reps;Seated Long Arc Quad: AAROM;Right;5 reps;Limitations Long [employer] Limitations: pain Goniometric ROM: pt greatly limited by pain; -5 to 40 degrees AROM due to pain     General Comments General comments (skin integrity, edema, etc.): redness noted around dressing; RN is aware       Pertinent Vitals/Pain Pain Assessment: 0-10 Pain Score:  (11) Pain Location: anterior portion of Rt  knee Pain Descriptors / Indicators: Aching;Constant;Crying Pain Intervention(s): Monitored during session;Limited activity within patient's tolerance;Premedicated before session;Repositioned;Ice applied    Home Living                       Prior Function            PT Goals (current goals can now be found in the care plan section) Acute Rehab PT Goals Patient Stated Goal: to not have pain  PT Goal Formulation: With patient Time For Goal Achievement: 11/23/13 Potential to Achieve Goals: Good Progress towards PT goals: Progressing toward goals    Frequency  7X/week    PT Plan Current plan remains appropriate    Co-evaluation             End of Session Equipment Utilized During Treatment: Gait belt;Right knee immobilizer Activity Tolerance: Patient limited by fatigue;Patient limited by pain Patient left: in chair;with call bell/phone within reach     Time: 1030-1057 PT Time Calculation (min): 27 min  Charges:  $Gait Training: 8-22 mins $Therapeutic Exercise: 8-22 mins                    G CodesGustavus Bryant, [place]  [phone] 11/10/2013, 11:10 AM

## 2013-11-10 NOTE — Anesthesia Postprocedure Evaluation (Signed)
  Anesthesia Post-op Note  Patient: Lacey [name]  Procedure(s) Performed: Procedure(s): TOTAL KNEE ARTHROPLASTY (Right)  Patient Location: PACU  Anesthesia Type:Spinal  Level of Consciousness: awake, alert  and oriented  Airway and Oxygen Therapy: Patient Spontanous Breathing and Patient connected to nasal cannula oxygen  Post-op Pain: mild  Post-op Assessment: Post-op Vital signs reviewed, Patient's Cardiovascular Status Stable, Respiratory Function Stable, Patent Airway, No signs of Nausea or vomiting and Pain level controlled  Post-op Vital Signs: stable  Last Vitals:  Filed Vitals:   11/10/13 1224  BP: 137/75  Pulse: 77  Temp: 36.9 C  Resp: 16    Complications: No apparent anesthesia complications

## 2013-11-10 NOTE — Progress Notes (Signed)
Physical Therapy Treatment Patient Details Name: Lacey [name] MRN: [id number] DOB: 1955-08-11 Today's Date: 11/10/2013    History of Present Illness 58 y.o. s/p Rt TKA. WBAT RLE.    PT Comments    Pt very slow to progress with mobility due to incr pain. This session pt did report pain as low as 8/10; which is the least it has been since surgery. Cont to follow per POC until pt is appropriate for D/C.   Follow Up Recommendations  Home health PT;Supervision/Assistance - 24 hour     Equipment Recommendations  Rolling walker with 5" wheels    Recommendations for Other Services       Precautions / Restrictions Precautions Precautions: Knee Precaution Comments: reinforced knee precautions  Required Braces or Orthoses: Knee Immobilizer - Right Knee Immobilizer - Right: On when out of bed or walking Restrictions Weight Bearing Restrictions: Yes RLE Weight Bearing: Weight bearing as tolerated    Mobility  Bed Mobility Overal bed mobility: Needs Assistance Bed Mobility: Supine to Sit;Sit to Supine     Supine to sit: Min guard;HOB elevated Sit to supine: Min guard   General bed mobility comments: educate don how to (A) Rt LE into/out of bed with UE use; incr time due to pain   Transfers Overall transfer level: Needs assistance Equipment used: Rolling walker (2 wheeled) Transfers: Sit to/from Stand Sit to Stand: Min guard         General transfer comment: cues for hand placement and RW safety; pt attempting to stand prior to having RW in position   Ambulation/Gait Ambulation/Gait assistance: Min guard Ambulation Distance (Feet): 80 Feet Assistive device: Rolling walker (2 wheeled) Gait Pattern/deviations: Step-to pattern;Decreased step length - left;Decreased stance time - right;Antalgic;Narrow base of support;Trunk flexed Gait velocity: decreased Gait velocity interpretation: Below normal speed for age/gender General Gait Details: cues for upright posture and  proper gt sequencing; no LOB noted but pt guarded due to impulsiveness at times    Stairs            Wheelchair Mobility    Modified Rankin (Stroke Patients Only)       Balance Overall balance assessment: Needs assistance Sitting-balance support: Feet supported;No upper extremity supported Sitting balance-Leahy Scale: Good Sitting balance - Comments: denied dizziness   Standing balance support: During functional activity;Bilateral upper extremity supported Standing balance-Leahy Scale: Poor Standing balance comment: relying heavily on RW for balance due to pain with WB on Rt LE                     Cognition Arousal/Alertness: Awake/alert Behavior During Therapy: Flat affect Overall Cognitive Status: Within Functional Limits for tasks assessed       Memory: Decreased recall of precautions              Exercises Total Joint Exercises Ankle Circles/Pumps: AROM;Both;15 reps;Supine Quad Sets: AROM;Strengthening;Right;10 reps;Seated Heel Slides: AAROM;10 reps;Seated Hip ABduction/ADduction: AAROM;Strengthening;Right;10 reps;Supine Goniometric ROM: AROM to 50 degrees in flexion sitting EOB this session     General Comments        Pertinent Vitals/Pain Pain Assessment: 0-10 Pain Score: 8  Pain Location: Rt knee Pain Descriptors / Indicators: Constant Pain Intervention(s): Monitored during session;Premedicated before session;Repositioned;Ice applied    Home Living                      Prior Function            PT Goals (current goals can now be  found in the care plan section) Acute Rehab PT Goals Patient Stated Goal: to not have pain PT Goal Formulation: With patient Time For Goal Achievement: 11/23/13 Potential to Achieve Goals: Good Progress towards PT goals: Progressing toward goals    Frequency  7X/week    PT Plan Current plan remains appropriate    Co-evaluation             End of Session Equipment Utilized During  Treatment: Gait belt;Right knee immobilizer Activity Tolerance: Patient tolerated treatment well Patient left: in bed;with call bell/phone within reach;with family/visitor present     Time: 2620-3559 PT Time Calculation (min): 25 min  Charges:  $Gait Training: 8-22 mins $Therapeutic Exercise: 8-22 mins                    G CodesGustavus Bryant, [place]  [phone] 11/10/2013, 4:39 PM

## 2013-11-11 ENCOUNTER — Encounter (HOSPITAL_COMMUNITY): Payer: Self-pay | Admitting: Orthopaedic Surgery

## 2013-11-11 LAB — CBC
HCT: 31.8 % — ABNORMAL LOW (ref 36.0–46.0)
HEMOGLOBIN: 10.5 g/dL — AB (ref 12.0–15.0)
MCH: 29.2 pg (ref 26.0–34.0)
MCHC: 33 g/dL (ref 30.0–36.0)
MCV: 88.3 fL (ref 78.0–100.0)
PLATELETS: 261 10*3/uL (ref 150–400)
RBC: 3.6 MIL/uL — ABNORMAL LOW (ref 3.87–5.11)
RDW: 13.5 % (ref 11.5–15.5)
WBC: 5.7 10*3/uL (ref 4.0–10.5)

## 2013-11-11 MED ORDER — ALPRAZOLAM 0.5 MG PO TABS: 0.5000 mg | ORAL_TABLET | Freq: Every evening | ORAL | Status: DC | PRN

## 2013-11-11 MED ORDER — HYDROCODONE-ACETAMINOPHEN 5-325 MG PO TABS: 1.0000 | ORAL_TABLET | Freq: Four times a day (QID) | ORAL | Status: DC | PRN

## 2013-11-11 MED ORDER — METHOCARBAMOL 500 MG PO TABS: 500.0000 mg | ORAL_TABLET | Freq: Four times a day (QID) | ORAL | Status: DC | PRN

## 2013-11-11 MED ORDER — ASPIRIN 325 MG PO TBEC: 325.0000 mg | DELAYED_RELEASE_TABLET | Freq: Two times a day (BID) | ORAL | Status: DC

## 2013-11-11 NOTE — Progress Notes (Signed)
Physical Therapy Treatment Patient Details Name: Lacey [name] MRN: [id number] DOB: Feb 05, 1955 Today's Date: 11/11/2013    History of Present Illness 58 y.o. s/p Rt TKA. WBAT RLE.    PT Comments    Pt requesting second session to review mobility and HEP. Pt educated on additional standing exercises to promote ROM and strengthening. Pt safe from mobility standpoint to D/C home today.   Follow Up Recommendations  Home health PT;Supervision/Assistance - 24 hour     Equipment Recommendations  Rolling walker with 5" wheels    Recommendations for Other Services       Precautions / Restrictions Precautions Precautions: Knee Precaution Comments: reinforced knee precautions  Knee Immobilizer - Right: Other (comment) (D/C by PA) Restrictions Weight Bearing Restrictions: Yes RLE Weight Bearing: Weight bearing as tolerated    Mobility  Bed Mobility Overal bed mobility: Modified Independent Bed Mobility: Supine to Sit;Sit to Supine           General bed mobility comments: use of UEs and towel with lifting Rt LE   Transfers Overall transfer level: Modified independent Equipment used: Rolling walker (2 wheeled) Transfers: Sit to/from Stand           General transfer comment: demo good technique with transfers  Ambulation/Gait Ambulation/Gait assistance: Supervision Ambulation Distance (Feet): 320 Feet Assistive device: Rolling walker (2 wheeled) Gait Pattern/deviations: Narrow base of support;Shuffle;Decreased stride length (bil IR LEs ) Gait velocity: decreased Gait velocity interpretation: Below normal speed for age/gender General Gait Details: cues for widen BOS to balance; pt also with tendency to IR LEs; educated on keeping LEs forward to prevent fall; pt progressing to step through gt; cues for eyes fwd and upright posture    Stairs Stairs:  (verbally reviewed technique )          Wheelchair Mobility    Modified Rankin (Stroke Patients Only)       Balance Overall balance assessment: No apparent balance deficits (not formally assessed)                                  Cognition Arousal/Alertness: Awake/alert Behavior During Therapy: WFL for tasks assessed/performed Overall Cognitive Status: Within Functional Limits for tasks assessed                      Exercises Total Joint Exercises Knee Flexion: AROM;Right;Standing Marching in Standing: AROM;10 reps;Right (with UEs supported by RW) Standing Hip Extension: AROM;Right;10 reps;Standing    General Comments General comments (skin integrity, edema, etc.): reviewed newly addition exercises to HEP and some of the previous exercises      Pertinent Vitals/Pain Pain Assessment: 0-10 Pain Score: 2  Pain Location: Rt knee  Pain Descriptors / Indicators: Sore Pain Intervention(s): Monitored during session;Premedicated before session;Repositioned;Ice applied    Home Living                      Prior Function            PT Goals (current goals can now be found in the care plan section) Acute Rehab PT Goals Patient Stated Goal: to get my muscles doing more PT Goal Formulation: With patient Time For Goal Achievement: 11/23/13 Potential to Achieve Goals: Good Progress towards PT goals: Progressing toward goals    Frequency  7X/week    PT Plan Current plan remains appropriate    Co-evaluation  End of Session   Activity Tolerance: Patient tolerated treatment well Patient left: in bed;with call bell/phone within reach     Time: 1346-1410 PT Time Calculation (min): 24 min  Charges:  $Gait Training: 8-22 mins $Therapeutic Exercise: 8-22 mins                    G Codes:      Lacey [name], [place] 11/11/2013, 3:45 PM

## 2013-11-11 NOTE — Progress Notes (Addendum)
Occupational Therapy Treatment Patient Details Name: Lacey [name] MRN: [id number] DOB: 02/12/1955 Today's Date: 11/11/2013    History of present illness 58 y.o. s/p Rt TKA. WBAT RLE.   OT comments  Progressing well with acute OT therapy.  Ready for d/c from OT and is going home today.  Able to perform LB dressing with s, no A/E needed.  Able to perform toilet and shower transfer with S.  Will notify OTR/L goals met.  Follow Up Recommendations  No OT follow up;Supervision/Assistance - 24 hour    Equipment Recommendations  3 in 1 bedside comode;Other (comment)    Recommendations for Other Services      Precautions / Restrictions Precautions Precautions: Knee Required Braces or Orthoses:  (MD present at end of session states KI not necessary during ambulation) Restrictions RLE Weight Bearing: Weight bearing as tolerated       Mobility Bed Mobility Overal bed mobility: Modified Independent                Transfers Overall transfer level: Needs assistance Equipment used: Rolling walker (2 wheeled) Transfers: Sit to/from Stand Sit to Stand: Supervision Stand pivot transfers: Supervision            Balance                                   ADL Overall ADL's : Needs assistance/impaired     Grooming: Wash/dry hands;Supervision/safety;Standing               Lower Body Dressing: Supervision/safety;Sitting/lateral leans Lower Body Dressing Details (indicate cue type and reason): able to don/doff socks and pants sitting in bed, able to bend and reach feet to pull over feet and lift hips to pull all the way up Toilet Transfer: Supervision/safety;Ambulation;RW;Grab bars   Toileting- Clothing Manipulation and Hygiene: Supervision/safety;Sit to/from stand Shower transfer: S in/out of shower stall with RW       Functional mobility during ADLs: Supervision/safety General ADL Comments: feeling much better today.  md present at end of session and  ready for d/c home today.  able to perform LB dressing with s in bed no A/E needed, also safe with toilet and shower stall transfers      Vision                     Perception     Praxis      Cognition   Behavior During Therapy: WFL for tasks assessed/performed Overall Cognitive Status: Within Functional Limits for tasks assessed                       Extremity/Trunk Assessment               Exercises     Shoulder Instructions       General Comments      Pertinent Vitals/ Pain       Pain Assessment: 0-10 Pain Score: 5  Pain Location: R knew Pain Descriptors / Indicators: Aching Pain Intervention(s): Premedicated before session  Home Living                                          Prior Functioning/Environment              Frequency Min 2X/week       Progress Toward Goals  OT Goals(current goals can now be found in the care plan section)  Progress towards OT goals: Progressing toward goals     Plan Discharge plan remains appropriate    Co-evaluation                 End of Session Equipment Utilized During Treatment: Gait belt;Rolling walker   Activity Tolerance Patient tolerated treatment well   Patient Left in chair;with call bell/phone within reach   Nurse Communication          Time: 7824-2353 OT Time Calculation (min): 25 min  Charges: OT General Charges $OT Visit: 1 Procedure OT Treatments $Self Care/Home Management : 23-37 mins  Janice Coffin, COTA/L 11/11/2013, 10:04 AM

## 2013-11-11 NOTE — Care Management Note (Signed)
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 Patient had a right total knee arthroplasty.     Action/Plan:   Case manager spoke with patient concerning home health needs. patient is under worker's comp.   Anticipated DC Date:  11/11/2013   Anticipated DC Plan:  [place] [facility]  CM consult      [facility] Choice  HOME HEALTH  DURABLE MEDICAL EQUIPMENT   Choice offered to / List presented to:  NA   DME arranged  [employer]  [facility]  3-N-1      DME agency  [employer]     [place] arranged  HH-2 PT      [place].   Status of service:  Completed, signed off Medicare Important Message given?   (If response is "NO", the following Medicare IM given date fields will be blank) Date Medicare IM given:   Medicare IM given by:   Date Additional Medicare IM given:   Additional Medicare IM given by:    Discharge Disposition:  [place]  Per UR Regulation:  Reviewed for med. necessity/level of care/duration of stay

## 2013-11-11 NOTE — Progress Notes (Signed)
Physical Therapy Treatment Patient Details Name: Lacey [name] MRN: [id number] DOB: 1955-07-29 Today's Date: 11/11/2013    History of Present Illness 58 y.o. s/p Rt TKA. WBAT RLE.    PT Comments    Pt mobilizing much better today vs yesterday. Reports pain is much more controlled. Reviewed stair management technique and exercise program. Pt hopeful to D/C later this afternoon.   Follow Up Recommendations  Home health PT;Supervision/Assistance - 24 hour     Equipment Recommendations  Rolling walker with 5" wheels    Recommendations for Other Services       Precautions / Restrictions Precautions Precautions: Knee Precaution Comments: reinforced knee precautions  Required Braces or Orthoses:  (MD present at end of session states KI not necessary during ambulation) Knee Immobilizer - Right: Other (comment) (D/C by PA) Restrictions Weight Bearing Restrictions: Yes RLE Weight Bearing: Weight bearing as tolerated    Mobility  Bed Mobility Overal bed mobility: Needs Assistance         Sit to supine: Supervision   General bed mobility comments: cues for technique   Transfers Overall transfer level: Needs assistance Equipment used: Rolling walker (2 wheeled) Transfers: Sit to/from Stand Sit to Stand: Supervision Stand pivot transfers: Supervision       General transfer comment: transferred from multiple height surfaces; cues for technique and safety  Ambulation/Gait Ambulation/Gait assistance: Supervision Ambulation Distance (Feet): 280 Feet (140' x2) Assistive device: Rolling walker (2 wheeled) Gait Pattern/deviations: Decreased stance time - right;Decreased step length - left;Antalgic Gait velocity: decreased Gait velocity interpretation: Below normal speed for age/gender General Gait Details: cues for upright posture; widen BOS to increase balance and step through gt sequencing; pt impulsive at times and cues for safety   Stairs Stairs: Yes Stairs  assistance: Supervision Stair Management: Step to pattern;Sideways;One rail Right Number of Stairs: 3 General stair comments: cues for technique  Wheelchair Mobility    Modified Rankin (Stroke Patients Only)       Balance Overall balance assessment: Needs assistance Sitting-balance support: Feet supported;No upper extremity supported Sitting balance-Leahy Scale: Good     Standing balance support: During functional activity;Bilateral upper extremity supported Standing balance-Leahy Scale: Poor Standing balance comment: relies on RW                    Cognition Arousal/Alertness: Awake/alert Behavior During Therapy: WFL for tasks assessed/performed Overall Cognitive Status: Within Functional Limits for tasks assessed       Memory: Decreased recall of precautions              Exercises Total Joint Exercises Ankle Circles/Pumps: AROM;Both;15 reps;Supine Quad Sets: AAROM;Right;10 reps;Seated Short Arc Quad: AAROM;Right;Strengthening;5 reps;Limitations Short Arc Quad Limitations: pain Hip ABduction/ADduction: AAROM;Strengthening;Right;10 reps;Supine Long Arc Quad: AAROM;10 reps;Seated Long Arc Quad Limitations: pain Goniometric ROM: AROM in sitting to 70 degrees flexion     General Comments General comments (skin integrity, edema, etc.): reviewed HEP with pt ; pt with difficulty firing quads       Pertinent Vitals/Pain Pain Assessment: 0-10 Pain Score: 8  Pain Location: Rt knee with exercises primarily Pain Descriptors / Indicators: Aching Pain Intervention(s): Monitored during session;Premedicated before session;Repositioned;Ice applied    Home Living                      Prior Function            PT Goals (current goals can now be found in the care plan section) Acute Rehab PT Goals Patient Stated  Goal: home today around 5 PT Goal Formulation: With patient Time For Goal Achievement: 11/23/13 Potential to Achieve Goals: Good Progress  towards PT goals: Progressing toward goals    Frequency  7X/week    PT Plan Current plan remains appropriate    Co-evaluation             End of Session Equipment Utilized During Treatment: Gait belt Activity Tolerance: Patient tolerated treatment well Patient left: in bed;with call bell/phone within reach;with family/visitor present     Time: 2355-7322 PT Time Calculation (min): 25 min  Charges:  $Gait Training: 8-22 mins $Therapeutic Exercise: 8-22 mins                    G CodesGustavus Bryant, [place]  [phone] 11/11/2013, 11:42 AM

## 2013-11-11 NOTE — Progress Notes (Signed)
Subjective: 2 Days Post-Op Procedure(s) (LRB): TOTAL KNEE ARTHROPLASTY (Right)  Activity level:  wbat Diet tolerance:  eatign well Voiding:  ok Patient reports pain as mild.    Objective: Vital signs in last 24 hours: Temp:  [98.4 F (36.9 C)-98.9 F (37.2 C)] 98.4 F (36.9 C) (10/29 0606) Pulse Rate:  [77-83] 83 (10/29 0606) Resp:  [16-20] 17 (10/28 1900) BP: (124-137)/(69-75) 124/69 mmHg (10/29 0606) SpO2:  [96 %-98 %] 98 % (10/29 0606)  Labs:  Recent Labs  11/10/13 0545 11/11/13 0500  HGB 11.2* 10.5*    Recent Labs  11/10/13 0545 11/11/13 0500  WBC 7.4 5.7  RBC 3.86* 3.60*  HCT 32.7* 31.8*  PLT 231 261    Recent Labs  11/10/13 0800  NA 136*  K 4.0  CL 98  CO2 27  BUN 7  CREATININE 0.77  GLUCOSE 151*  CALCIUM 9.1   No results found for this basename: LABPT, INR,  in the last 72 hours  Physical Exam:  Neurologically intact ABD soft Neurovascular intact Sensation intact distally Intact pulses distally Dorsiflexion/Plantar flexion intact Incision: dressing C/D/I No cellulitis present Compartment soft  Assessment/Plan:  2 Days Post-Op Procedure(s) (LRB): TOTAL KNEE ARTHROPLASTY (Right) Advance diet Up with therapy Discharge home with home health today. Continue on ASA 325mg  BID x 2 weeks post op.  Follow up in office 2 weeks post op.  Will prescribe xanax for at night to help patient relax and sleep.    Leana Springston, Larwance Sachs 11/11/2013, 9:44 AM

## 2013-11-11 NOTE — Discharge Summary (Signed)
Patient ID: Lacey [name] MRN: [id number] DOB/AGE: February 05, 1955 58 y.o.  Admit date: 11/09/2013 Discharge date: 11/11/2013  Admission Diagnoses:  Principal Problem:   Right knee DJD   Discharge Diagnoses:  Same  Past Medical History  Diagnosis Date  . GERD (gastroesophageal reflux disease)     Surgeries: Procedure(s): TOTAL KNEE ARTHROPLASTY on 11/09/2013   Consultants:    Discharged Condition: Improved  Hospital Course: TUANA HOHEISEL is an 58 y.o. female who was admitted 11/09/2013 for operative treatment ofRight knee DJD. Patient has severe unremitting pain that affects sleep, daily activities, and work/hobbies. After pre-op clearance the patient was taken to the operating room on 11/09/2013 and underwent  Procedure(s): TOTAL KNEE ARTHROPLASTY.    Patient was given perioperative antibiotics: Anti-infectives   Start     Dose/Rate Route Frequency Ordered Stop   11/09/13 1600  ceFAZolin (ANCEF) IVPB 2 g/50 mL premix     2 g 100 mL/hr over 30 Minutes Intravenous Every 6 hours 11/09/13 1504 11/09/13 2252   11/09/13 0600  ceFAZolin (ANCEF) IVPB 2 g/50 mL premix     2 g 100 mL/hr over 30 Minutes Intravenous On call to O.R. 11/08/13 1437 11/09/13 0748       Patient was given sequential compression devices, early ambulation, and chemoprophylaxis to prevent DVT.  Patient benefited maximally from hospital stay and there were no complications.    Recent vital signs: Patient Vitals for the past 24 hrs:  BP Temp Temp src Pulse Resp SpO2  11/11/13 0606 124/69 mmHg 98.4 F (36.9 C) - 83 - 98 %  11/10/13 1900 137/75 mmHg 98.9 F (37.2 C) Oral 82 17 97 %  11/10/13 1600 - - - - 16 -  11/10/13 1224 137/75 mmHg 98.4 F (36.9 C) - 77 16 96 %  11/10/13 1200 - - - - 20 -     Recent laboratory studies:  Recent Labs  11/10/13 0545 11/10/13 0800 11/11/13 0500  WBC 7.4  --  5.7  HGB 11.2*  --  10.5*  HCT 32.7*  --  31.8*  PLT 231  --  261  NA  --  136*  --   K  --  4.0   --   CL  --  98  --   CO2  --  27  --   BUN  --  7  --   CREATININE  --  0.77  --   GLUCOSE  --  151*  --   CALCIUM  --  9.1  --      Discharge Medications:     Medication List         ALPRAZolam 0.5 MG tablet  Commonly known as:  XANAX  Take 1 tablet (0.5 mg total) by mouth at bedtime as needed for anxiety or sleep.     aspirin 325 MG EC tablet  Take 1 tablet (325 mg total) by mouth 2 (two) times daily after a meal.     HYDROcodone-acetaminophen 5-325 MG per tablet  Commonly known as:  NORCO/VICODIN  Take 1-2 tablets by mouth 4 (four) times daily as needed for moderate pain.     methocarbamol 500 MG tablet  Commonly known as:  ROBAXIN  Take 1 tablet (500 mg total) by mouth every 6 (six) hours as needed for muscle spasms.        Diagnostic Studies: Dg Chest 2 View  11/01/2013   CLINICAL DATA:  Preop evaluation for right knee replacement.  EXAM: CHEST  2  VIEW  COMPARISON:  03/18/2011  FINDINGS: The cardiac shadow is within normal limits. Multiple calcified granulomas are again seen and stable. No focal infiltrate or sizable effusion is identified. No bony abnormality seen.  IMPRESSION: No acute abnormality noted.   Electronically Signed   By: Inez Catalina M.D.   On: 11/01/2013 17:02    Disposition: 01-Home or Self Care      Discharge Instructions   Call MD / Call 911    Complete by:  As directed   If you experience chest pain or shortness of breath, CALL 911 and be transported to the hospital emergency room.  If you develope a fever above 101 F, pus (white drainage) or increased drainage or redness at the wound, or calf pain, call your surgeon's office.     Constipation Prevention    Complete by:  As directed   Drink plenty of fluids.  Prune juice may be helpful.  You may use a stool softener, such as Colace (over the counter) 100 mg twice a day.  Use MiraLax (over the counter) for constipation as needed.     Diet - low sodium heart healthy    Complete by:  As directed       Increase activity slowly as tolerated    Complete by:  As directed            Follow-up Information   Follow up with Hessie Dibble, MD. Call in 2 weeks.   Specialty:  Orthopedic Surgery   Contact information:   [place] [address] [phone]        Signed: Rich Fuchs 11/11/2013, 9:49 AM

## 2013-11-11 NOTE — Progress Notes (Signed)
Spoke with OTA Sonia Baller [place]). All education completed and pt to D/C today. We will sign off. [place], [place] [phone] 11/11/2013

## 2014-04-22 ENCOUNTER — Other Ambulatory Visit (HOSPITAL_COMMUNITY): Payer: Self-pay | Admitting: Orthopaedic Surgery

## 2014-04-22 DIAGNOSIS — M25512 Pain in left shoulder: Secondary | ICD-10-CM

## 2014-04-27 ENCOUNTER — Ambulatory Visit (HOSPITAL_COMMUNITY)
Admission: RE | Admit: 2014-04-27 | Discharge: 2014-04-27 | Disposition: A | Discharge status: Home / Self Care | Payer: PRIVATE HEALTH INSURANCE | Source: Ambulatory Visit | Attending: Orthopaedic Surgery | Admitting: Orthopaedic Surgery

## 2014-04-27 DIAGNOSIS — M25412 Effusion, left shoulder: Secondary | ICD-10-CM | POA: Diagnosis not present

## 2014-04-27 DIAGNOSIS — M7552 Bursitis of left shoulder: Secondary | ICD-10-CM | POA: Insufficient documentation

## 2014-04-27 DIAGNOSIS — X58XXXA Exposure to other specified factors, initial encounter: Secondary | ICD-10-CM | POA: Diagnosis not present

## 2014-04-27 DIAGNOSIS — S43432A Superior glenoid labrum lesion of left shoulder, initial encounter: Secondary | ICD-10-CM | POA: Insufficient documentation

## 2014-04-27 DIAGNOSIS — M25512 Pain in left shoulder: Secondary | ICD-10-CM | POA: Diagnosis present

## 2014-04-27 DIAGNOSIS — M75112 Incomplete rotator cuff tear or rupture of left shoulder, not specified as traumatic: Secondary | ICD-10-CM | POA: Diagnosis not present

## 2014-05-02 ENCOUNTER — Ambulatory Visit (HOSPITAL_COMMUNITY): Payer: 59

## 2014-05-02 ENCOUNTER — Ambulatory Visit: Payer: 59 | Admitting: Family Medicine

## 2014-05-26 ENCOUNTER — Ambulatory Visit: Payer: PRIVATE HEALTH INSURANCE | Attending: Orthopaedic Surgery

## 2014-05-26 DIAGNOSIS — M25512 Pain in left shoulder: Secondary | ICD-10-CM | POA: Insufficient documentation

## 2014-05-26 DIAGNOSIS — M25612 Stiffness of left shoulder, not elsewhere classified: Secondary | ICD-10-CM | POA: Diagnosis not present

## 2014-05-26 NOTE — Therapy (Signed)
[place], [place], [address] Phone: [phone]   Fax:  [phone]  Physical Therapy Treatment  Patient Details  Name: Lacey [name] MRN: [id number] Date of Birth: 1955-10-31 Referring Provider:  Garald Balding, MD  Encounter Date: 05/26/2014      PT End of Session - 05/26/14 1539    Visit Number 1   Number of Visits 12   Date for PT Re-Evaluation 07/14/14   PT Start Time 0250   PT Stop Time 0340   PT Time Calculation (min) 50 min   Activity Tolerance Patient tolerated treatment well   Behavior During Therapy [facility] for tasks assessed/performed      Past Medical History  Diagnosis Date  . GERD (gastroesophageal reflux disease)     Past Surgical History  Procedure Laterality Date  . Orif ankle fracture  2009    left  . Hardware removal  2010    left ankle  . Upper gastrointestinal endoscopy    . Ercp  2012  . Total abdominal hysterectomy  2003    rt ovary,  . Anterior and posterior repair  2003    with the TAH  . Knee arthroscopy      right and left  . Tonsillectomy    . Shoulder open rotator cuff repair  11/05/2011    Procedure: ROTATOR CUFF REPAIR SHOULDER OPEN;  Surgeon: Hessie Dibble, MD;  Location: [place];  Service: Orthopedics;  Laterality: Right;  RIGHT SHOULDER OPEN ROTATOR CUFF REPAIR  . Bicept tenodesis  11/05/2011    Procedure: BICEPS TENODESIS;  Surgeon: Hessie Dibble, MD;  Location: [place];  Service: Orthopedics;  Laterality: Right;   TENODESIS  AND  ACRMIOCLAVICULAR RESECTION   . Total knee arthroplasty Right 11/09/2013    Procedure: TOTAL KNEE ARTHROPLASTY;  Surgeon: Hessie Dibble, MD;  Location: [place];  Service: Orthopedics;  Laterality: Right;    There were no vitals filed for this visit.  Visit Diagnosis:  Pain in joint, shoulder region, left - Plan: PT plan of care cert/re-cert  Stiffness of left shoulder joint -  Plan: PT plan of care cert/re-cert      Subjective Assessment - 05/26/14 1454    Subjective She report LT bicep tendon and superior labral tear LT shoulder.    Pertinent History She reports doing [place] CPR for recertification. 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 Yes   Pain Score 3   At worst level to 7/10 end of work day   Pain Location Shoulder   Pain Orientation Left   Pain Descriptors / Indicators Sharp;Aching   Pain Type Acute pain   Pain Onset More than a month ago   Pain Frequency Constant   Aggravating Factors  using LT arm   Pain Relieving Factors Rest ,cold, medications   Effect of Pain on Daily Activities pain with work and home activity   Multiple Pain Sites No            [facility] PT Assessment - 05/26/14 1502    Assessment   Medical Diagnosis Lt shoulder labral tear   Onset Date 03/24/14   Next MD Visit 6weeks for this week  Prior Therapy For knee and sjhoulder injury/surgery.    Precautions   Precautions None   Restrictions   Weight Bearing Restrictions No   Prior Function   Level of Independence Independent with basic ADLs   Cognition   Overall Cognitive Status Within Functional Limits for tasks assessed   ROM / Strength   AROM / PROM / Strength AROM;PROM;Strength   AROM   AROM Assessment Site Shoulder   Right/Left Shoulder Right;Left   Right Shoulder Extension 62 Degrees   Right Shoulder Flexion 163 Degrees   Right Shoulder ABduction 172 Degrees   Right Shoulder Internal Rotation 55 Degrees   Right Shoulder External Rotation 95 Degrees   Left Shoulder Extension 55 Degrees   Left Shoulder Flexion 148 Degrees   Left Shoulder ABduction 160 Degrees   Left Shoulder Internal Rotation 25 Degrees    Left Shoulder External Rotation 92 Degrees   PROM   Overall PROM Comments Full range all ranges LT shoulder  and cervicaal spine rotation and sidebend   PROM Assessment Site Shoulder   Right/Left Shoulder Left   Strength   Overall Strength Comments WNL LT shoulder with mild give with flex and abduciton due to pain.    Palpation   Palpation Tender RT anterior shoulder and around scapula and posterior shoulder .  mild tenderness in spraspinatus.                              PT Education - 05/26/14 1539    Education provided Yes   Education Details POC   Person(s) Educated Patient   Methods Explanation   Comprehension Verbalized understanding          PT Short Term Goals - 05/26/14 1532    PT SHORT TERM GOAL #1   Title She will be able to do all initial HEP    Time 3   Period Weeks   PT SHORT TERM GOAL #2   Title She will report pain improved 30 % or more with work tasks   Time 3   Period Weeks   Status New   PT SHORT TERM GOAL #3   Title Active LT shoulder range equal LT   Time 3   Period Weeks   Status New           PT Long Term Goals - 05/26/14 1534    PT LONG TERM GOAL #1   Title she will  decrease pain 50% or more with work tasks   Time 6   Period Weeks   Status New   PT LONG TERM GOAL #2   Title She will be able to do all HEP issued as of last visit.    Time 6   Period Weeks   Status New   PT LONG TERM GOAL #3   Title she will be able to sleep in bed.    Time 6   Period Weeks   Status New               Plan - 05/26/14 1540    Clinical Impression Statement She is pain dominent and has done a good job of maintaining her strength and flexibility. Her prognosis of improvement in PT is fair  but will try to improve pain so function is improved   Pt will benefit from skilled therapeutic intervention in order to improve on the following deficits Decreased range of motion;Pain;Impaired UE functional use;Decreased  strength;Increased muscle spasms  Rehab Potential Fair   PT Frequency 2x / week   PT Duration 6 weeks   PT Treatment/Interventions Moist Heat;Ultrasound;Electrical Stimulation;Cryotherapy;Dry needling;Manual techniques;Passive range of motion;Therapeutic exercise;Patient/family education   PT Next Visit Plan Modalities, passive range, isometrics versus band,           G-Codes - 2014-06-03 1542    Functional Assessment Tool Used FOTO  50%      Problem List Patient Active Problem List   Diagnosis Date Noted  . Right knee DJD 11/09/2013    [name] PT Jun 03, 2014, 3:49 PM  [facility] [address] [place], [place], [address] Phone: [phone]   Fax:  [phone]

## 2014-05-31 ENCOUNTER — Ambulatory Visit: Payer: PRIVATE HEALTH INSURANCE | Attending: Family Medicine

## 2014-05-31 DIAGNOSIS — M25612 Stiffness of left shoulder, not elsewhere classified: Secondary | ICD-10-CM | POA: Insufficient documentation

## 2014-05-31 DIAGNOSIS — M25512 Pain in left shoulder: Secondary | ICD-10-CM | POA: Insufficient documentation

## 2014-05-31 NOTE — Therapy (Signed)
[place], [place], [address] Phone: [phone]   Fax:  [phone]  Physical Therapy Treatment  Patient Details  Name: Lacey [name] MRN: [id number] Date of Birth: 18-Mar-1955 Referring Provider:  Kathyrn Lass, MD  Encounter Date: 05/31/2014      PT End of Session - 05/31/14 1657    Visit Number 2   Number of Visits 12   Date for PT Re-Evaluation 07/14/14   PT Start Time 0345   PT Stop Time 0445   PT Time Calculation (min) 60 min   Activity Tolerance Patient tolerated treatment well   Behavior During Therapy [facility] for tasks assessed/performed      Past Medical History  Diagnosis Date  . GERD (gastroesophageal reflux disease)     Past Surgical History  Procedure Laterality Date  . Orif ankle fracture  2009    left  . Hardware removal  2010    left ankle  . Upper gastrointestinal endoscopy    . Ercp  2012  . Total abdominal hysterectomy  2003    rt ovary,  . Anterior and posterior repair  2003    with the TAH  . Knee arthroscopy      right and left  . Tonsillectomy    . Shoulder open rotator cuff repair  11/05/2011    Procedure: ROTATOR CUFF REPAIR SHOULDER OPEN;  Surgeon: Hessie Dibble, MD;  Location: [place];  Service: Orthopedics;  Laterality: Right;  RIGHT SHOULDER OPEN ROTATOR CUFF REPAIR  . Bicept tenodesis  11/05/2011    Procedure: BICEPS TENODESIS;  Surgeon: Hessie Dibble, MD;  Location: [place];  Service: Orthopedics;  Laterality: Right;   TENODESIS  AND  ACRMIOCLAVICULAR RESECTION   . Total knee arthroplasty Right 11/09/2013    Procedure: TOTAL KNEE ARTHROPLASTY;  Surgeon: Hessie Dibble, MD;  Location: [place];  Service: Orthopedics;  Laterality: Right;    There were no vitals filed for this visit.  Visit Diagnosis:  Pain in joint, shoulder region, left  Stiffness of left shoulder joint      Subjective Assessment - 05/31/14 1542     Subjective Pain is the same. Tens helped for knots in back last time.     Currently in Pain? Yes   Pain Score 2    Multiple Pain Sites No                         [facility] Adult PT Treatment/Exercise - 05/31/14 1653    Exercises   Exercises Shoulder   Shoulder Exercises: Standing   External Rotation Strengthening;Left;15 reps   Theraband Level (Shoulder External Rotation) Level 2 (Red)   Internal Rotation Strengthening;Left;12 reps   Theraband Level (Shoulder Internal Rotation) Level 2 (Red)   Flexion Strengthening;Left;12 reps   Theraband Level (Shoulder Flexion) Level 2 (Red)   Extension Strengthening;Left;12 reps   Theraband Level (Shoulder Extension) Level 2 (Red)   Modalities   Modalities Electrical Stimulation;Moist Heat   Moist Heat Therapy   Number Minutes Moist Heat 20 Minutes   Moist Heat Location Shoulder   Electrical Stimulation   Electrical Stimulation Location Shoulder Lt   Electrical Stimulation Action IFC   Electrical Stimulation Parameters L8    Electrical Stimulation Goals Pain   Manual Therapy   Manual Therapy Soft tissue mobilization   Manual therapy comments Kineseotape with Y to deltoid ant/post and  and 1 strip over shoulder at [facility]  jt   Soft tissue mobilization STM to Lt shoulder and scapula                  PT Short Term Goals - 05/26/14 1532    PT SHORT TERM GOAL #1   Title She will be able to do all initial HEP    Time 3   Period Weeks   PT SHORT TERM GOAL #2   Title She will report pain improved 30 % or more with work tasks   Time 3   Period Weeks   Status New   PT SHORT TERM GOAL #3   Title Active LT shoulder range equal LT   Time 3   Period Weeks   Status New           PT Long Term Goals - 05/26/14 1534    PT LONG TERM GOAL #1   Title she will  decrease pain 50% or more with work tasks   Time 6   Period Weeks   Status New   PT LONG TERM GOAL #2   Title She will be able to do all HEP issued as of last  visit.    Time 6   Period Weeks   Status New   PT LONG TERM GOAL #3   Title she will be able to sleep in bed.    Time 6   Period Weeks   Status New               Plan - 05/31/14 1657    Clinical Impression Statement she reported shoulder felt better after treatment . Will cont to worlk strength with bands and manual and modalities for pain   PT Next Visit Plan Modalities, manual/kineseotape,  band,    PT Home Exercise Plan band exercises, asked her to bring in HEP from previous PT   Consulted and Agree with Plan of Care Patient        Problem List Patient Active Problem List   Diagnosis Date Noted  . Right knee DJD 11/09/2013    [name] PT 05/31/2014, 5:01 PM  [facility] [address] [place], [place], [address] Phone: [phone]   Fax:  [phone]

## 2014-06-07 ENCOUNTER — Encounter: Payer: Self-pay | Admitting: Physical Therapy

## 2014-06-09 ENCOUNTER — Ambulatory Visit: Payer: PRIVATE HEALTH INSURANCE | Admitting: Physical Therapy

## 2014-06-09 DIAGNOSIS — M25512 Pain in left shoulder: Secondary | ICD-10-CM | POA: Diagnosis not present

## 2014-06-09 DIAGNOSIS — M25612 Stiffness of left shoulder, not elsewhere classified: Secondary | ICD-10-CM

## 2014-06-09 NOTE — Therapy (Signed)
[place], [place], [address] Phone: [phone]   Fax:  [phone]  Physical Therapy Treatment  Patient Details  Name: Lacey [name] MRN: [id number] Date of Birth: June 27, 1955 Referring Provider:  Garald Balding, MD  Encounter Date: 06/09/2014      PT End of Session - 06/09/14 1557    Visit Number 3   Number of Visits 12   Date for PT Re-Evaluation 07/14/14   PT Start Time 0314   PT Stop Time 0355   PT Time Calculation (min) 41 min      Past Medical History  Diagnosis Date  . GERD (gastroesophageal reflux disease)     Past Surgical History  Procedure Laterality Date  . Orif ankle fracture  2009    left  . Hardware removal  2010    left ankle  . Upper gastrointestinal endoscopy    . Ercp  2012  . Total abdominal hysterectomy  2003    rt ovary,  . Anterior and posterior repair  2003    with the TAH  . Knee arthroscopy      right and left  . Tonsillectomy    . Shoulder open rotator cuff repair  11/05/2011    Procedure: ROTATOR CUFF REPAIR SHOULDER OPEN;  Surgeon: Hessie Dibble, MD;  Location: [place];  Service: Orthopedics;  Laterality: Right;  RIGHT SHOULDER OPEN ROTATOR CUFF REPAIR  . Bicept tenodesis  11/05/2011    Procedure: BICEPS TENODESIS;  Surgeon: Hessie Dibble, MD;  Location: [place];  Service: Orthopedics;  Laterality: Right;   TENODESIS  AND  ACRMIOCLAVICULAR RESECTION   . Total knee arthroplasty Right 11/09/2013    Procedure: TOTAL KNEE ARTHROPLASTY;  Surgeon: Hessie Dibble, MD;  Location: [place];  Service: Orthopedics;  Laterality: Right;    There were no vitals filed for this visit.  Visit Diagnosis:  Pain in joint, shoulder region, left  Stiffness of left shoulder joint      Subjective Assessment - 06/09/14 1514    Subjective no pain now.  I slept with a hot pack last night   Currently in Pain? No/denies                          [facility] Adult PT Treatment/Exercise - 06/09/14 1606    Shoulder Exercises: Supine   Other Supine Exercises supine on foam roller pec stretch 2x30  tried yellow band horiz abdct x 10 increased pain   Shoulder Exercises: Seated   External Rotation Strengthening;20 reps;Theraband   Theraband Level (Shoulder External Rotation) Level 1 (Yellow)   External Rotation Limitations with elbow propped at 75 degrees abduction on tray table   Internal Rotation Strengthening;Left;15 reps;Theraband   Theraband Level (Shoulder Internal Rotation) Level 1 (Yellow)   Shoulder Exercises: Standing   External Rotation Strengthening;Left;20 reps   Theraband Level (Shoulder External Rotation) Level 1 (Yellow)   Internal Rotation Strengthening;Left;15 reps;Theraband   Theraband Level (Shoulder Internal Rotation) Level 1 (Yellow)   Internal Rotation Limitations used her therabands   Extension Strengthening;Both;20 reps;Theraband   Theraband Level (Shoulder Extension) Level 1 (Yellow)   Extension Limitations used her theraband which were stronger   Row Strengthening;Both;15 reps;Theraband   Theraband Level (Shoulder Row) Level 1 (Yellow)   Other Standing Exercises bicep curls yellow band x 20    Shoulder Exercises: ROM/Strengthening   Other ROM/Strengthening Exercises wall ladder stretching flexion x 5  Manual Therapy   Manual Therapy Soft tissue mobilization   Soft tissue mobilization STW to sub scapularis at medial and lateral borders, verry tender and reproduces pain in shoulder                PT Education - 06/09/14 1556    Education provided Yes   Education Details Biomedical engineer) Educated Patient   Methods Explanation;Handout   Comprehension Verbalized understanding          PT Short Term Goals - 05/26/14 1532    PT SHORT TERM GOAL #1   Title She will be able to do all initial HEP    Time 3   Period Weeks   PT SHORT TERM GOAL #2    Title She will report pain improved 30 % or more with work tasks   Time 3   Period Weeks   Status New   PT SHORT TERM GOAL #3   Title Active LT shoulder range equal LT   Time 3   Period Weeks   Status New           PT Long Term Goals - 05/26/14 1534    PT LONG TERM GOAL #1   Title she will  decrease pain 50% or more with work tasks   Time 6   Period Weeks   Status New   PT LONG TERM GOAL #2   Title She will be able to do all HEP issued as of last visit.    Time 6   Period Weeks   Status New   PT LONG TERM GOAL #3   Title she will be able to sleep in bed.    Time 6   Period Weeks   Status New               Plan - 06/09/14 1616    Clinical Impression Statement Pt presents with no pain. She brought her therabands from home. Reviewed exercises and added pec stretch. A lot of education provided about posture as pt sits in severe rounded posture with sitting. Upon palpation pt is very tender and medial and lateral subscapularis. Instructed pt in self massage/ trigger point release and recommended heating this area. She Continues to have increased pain with reaching combined with rotation of arm for job duties.    PT Next Visit Plan Modalities, manual/kineseotape,  band, strengthen as tolerated        Problem List Patient Active Problem List   Diagnosis Date Noted  . Right knee DJD 11/09/2013    Dorene Ar, PTA 06/09/2014, 4:22 PM  [facility] [address] [place], [place], [address] Phone: [phone]   Fax:  [phone]

## 2014-06-09 NOTE — Patient Instructions (Signed)
Flexibility: Corner Stretch   Standing in corner with hands just above shoulder level and feet __12__ inches from corner or in DOORWAY, lean forward until a comfortable stretch is felt across chest. Hold __30__ seconds. Repeat __3__ times per set. Do ___1_ sets per session. Do _2___ sessions per day.  http://orth.exer.us/343   Copyright  VHI. All rights reserved.

## 2014-06-15 ENCOUNTER — Ambulatory Visit: Payer: PRIVATE HEALTH INSURANCE | Attending: Orthopaedic Surgery | Admitting: Physical Therapy

## 2014-06-15 DIAGNOSIS — M25612 Stiffness of left shoulder, not elsewhere classified: Secondary | ICD-10-CM | POA: Diagnosis present

## 2014-06-15 DIAGNOSIS — M25512 Pain in left shoulder: Secondary | ICD-10-CM | POA: Insufficient documentation

## 2014-06-15 NOTE — Therapy (Signed)
[place], [place], [address] Phone: [phone]   Fax:  [phone]  Physical Therapy Treatment  Patient Details  Name: Lacey [name] MRN: [id number] Date of Birth: [birth year] Referring Provider:  Kathyrn Lass, MD  Encounter Date: 06/15/2014      PT End of Session - 06/15/14 1228    Visit Number 4   Number of Visits 12   Date for PT Re-Evaluation 07/14/14   PT Start Time 1115   PT Stop Time 1200   PT Time Calculation (min) 45 min      Past Medical History  Diagnosis Date  . GERD (gastroesophageal reflux disease)     Past Surgical History  Procedure Laterality Date  . Orif ankle fracture  2009    left  . Hardware removal  2010    left ankle  . Upper gastrointestinal endoscopy    . Ercp  2012  . Total abdominal hysterectomy  2003    rt ovary,  . Anterior and posterior repair  2003    with the TAH  . Knee arthroscopy      right and left  . Tonsillectomy    . Shoulder open rotator cuff repair  11/05/2011    Procedure: ROTATOR CUFF REPAIR SHOULDER OPEN;  Surgeon: Hessie Dibble, MD;  Location: [place];  Service: Orthopedics;  Laterality: Right;  RIGHT SHOULDER OPEN ROTATOR CUFF REPAIR  . Bicept tenodesis  11/05/2011    Procedure: BICEPS TENODESIS;  Surgeon: Hessie Dibble, MD;  Location: [place];  Service: Orthopedics;  Laterality: Right;   TENODESIS  AND  ACRMIOCLAVICULAR RESECTION   . Total knee arthroplasty Right 11/09/2013    Procedure: TOTAL KNEE ARTHROPLASTY;  Surgeon: Hessie Dibble, MD;  Location: [place];  Service: Orthopedics;  Laterality: Right;    There were no vitals filed for this visit.  Visit Diagnosis:  Pain in joint, shoulder region, left  Stiffness of left shoulder joint      Subjective Assessment - 06/15/14 1145    Subjective I feel like I am getting stronger. I still have pain.    Currently in Pain? No/denies                          [facility] Adult PT Treatment/Exercise - 06/15/14 1138    Shoulder Exercises: Supine   Other Supine Exercises supine 1# clocks 12 to6 and 3 to 9 then punches with 2# x 20, circles x 10 each way   Shoulder Exercises: Seated   External Rotation Strengthening;20 reps   External Rotation Weight (lbs) 2   External Rotation Limitations with elbow propped at 75 degrees abduction on tray table   Internal Rotation Strengthening;Left;15 reps;Theraband   Theraband Level (Shoulder Internal Rotation) Level 2 (Red)   Internal Rotation Limitations with elbow on tray table   Shoulder Exercises: Standing   External Rotation Strengthening;Left;20 reps   Theraband Level (Shoulder External Rotation) Level 2 (Red)   Internal Rotation Strengthening;Left;15 reps;Theraband   Theraband Level (Shoulder Internal Rotation) Level 2 (Red)   Flexion Strengthening;Left;12 reps   Theraband Level (Shoulder Flexion) Level 2 (Red)   Extension Strengthening;Both;20 reps;Theraband   Theraband Level (Shoulder Extension) Level 2 (Red)   Row Strengthening;Both;15 reps;Theraband   Theraband Level (Shoulder Row) Level 2 (Red)   Shoulder Exercises: Stretch   Corner Stretch 3 reps;30 seconds   Wall Stretch - Flexion 3 reps;30 seconds   Wall  Stretch - ABduction 1 rep;60 seconds   Other Shoulder Stretches lat stretch at wall x 30 sec                  PT Short Term Goals - 06/15/14 1136    PT SHORT TERM GOAL #1   Title She will be able to do all initial HEP    Time 3   Period Weeks   Status Achieved   PT SHORT TERM GOAL #2   Title She will report pain improved 30 % or more with work tasks   Time 3   Period Weeks   Status On-going  20%   PT SHORT TERM GOAL #3   Title Active LT shoulder range equal LT   Time 3   Period Weeks   Status On-going           PT Long Term Goals - 06/15/14 1137    PT LONG TERM GOAL #1   Title she will  decrease pain 50% or more with work  tasks   Time 6   Period Weeks   Status On-going   PT LONG TERM GOAL #2   Title She will be able to do all HEP issued as of last visit.    Time 6   Period Weeks   Status On-going   PT LONG TERM GOAL #3   Title she will be able to sleep in bed.    Time 6   Period Weeks   Status On-going               Plan - 06/15/14 1146    Clinical Impression Statement Pt reports her shoulder feels swollen and big after her exercises. She has been massaging her subscapularis. She alternates heat and ice at home. She continues to need multiple pillows in order to sleep in her bed. She reports 20 % decrease in pain and she feels stronger. She demonstrates weakness in flexion and abduction in standing. She tolerates strengthening better in gravity eliminated positions.    PT Next Visit Plan Modalities, manual/kineseotape,  band, strengthen as tolerated        Problem List Patient Active Problem List   Diagnosis Date Noted  . Right knee DJD 11/09/2013    Dorene Ar, PTA 06/15/2014, 12:29 PM  [facility] [address] [place], [place], [address] Phone: [phone]   Fax:  [phone]

## 2014-06-20 ENCOUNTER — Ambulatory Visit: Payer: PRIVATE HEALTH INSURANCE

## 2014-06-20 DIAGNOSIS — M25612 Stiffness of left shoulder, not elsewhere classified: Secondary | ICD-10-CM

## 2014-06-20 DIAGNOSIS — M25512 Pain in left shoulder: Secondary | ICD-10-CM | POA: Diagnosis not present

## 2014-06-20 NOTE — Therapy (Addendum)
[place], [place], [address] Phone: [phone]   Fax:  [phone]  Physical Therapy Treatment  Patient Details  Name: Lacey [name] MRN: [id number] Date of Birth: 05-26-1955 Referring Provider:  Kathyrn Lass, MD  Encounter Date: 06/20/2014      PT End of Session - 06/20/14 1639    Visit Number 5   Number of Visits 12   Date for PT Re-Evaluation 07/14/14   PT Start Time 0345   PT Stop Time 0408   PT Time Calculation (min) 23 min   Activity Tolerance Patient tolerated treatment well   Behavior During Therapy [facility] for tasks assessed/performed      Past Medical History  Diagnosis Date  . GERD (gastroesophageal reflux disease)     Past Surgical History  Procedure Laterality Date  . Orif ankle fracture  2009    left  . Hardware removal  2010    left ankle  . Upper gastrointestinal endoscopy    . Ercp  2012  . Total abdominal hysterectomy  2003    rt ovary,  . Anterior and posterior repair  2003    with the TAH  . Knee arthroscopy      right and left  . Tonsillectomy    . Shoulder open rotator cuff repair  11/05/2011    Procedure: ROTATOR CUFF REPAIR SHOULDER OPEN;  Surgeon: Hessie Dibble, MD;  Location: [place];  Service: Orthopedics;  Laterality: Right;  RIGHT SHOULDER OPEN ROTATOR CUFF REPAIR  . Bicept tenodesis  11/05/2011    Procedure: BICEPS TENODESIS;  Surgeon: Hessie Dibble, MD;  Location: [place];  Service: Orthopedics;  Laterality: Right;   TENODESIS  AND  ACRMIOCLAVICULAR RESECTION   . Total knee arthroplasty Right 11/09/2013    Procedure: TOTAL KNEE ARTHROPLASTY;  Surgeon: Hessie Dibble, MD;  Location: [place];  Service: Orthopedics;  Laterality: Right;    There were no vitals filed for this visit.  Visit Diagnosis:  Pain in joint, shoulder region, left  Stiffness of left shoulder joint      Subjective Assessment - 06/20/14 1643     Subjective I feel like I am getting stronger. I still have pain. I dont think pain will be bette but feel can do more   Currently in Pain? Yes   Pain Score 3    Pain Location Shoulder   Pain Orientation Left   Pain Descriptors / Indicators Aching   Pain Type Chronic pain   Pain Onset More than a month ago   Pain Frequency Constant   Aggravating Factors  using LT arm ,sleeping flat   Pain Relieving Factors rest , cold , stretching, meds   Multiple Pain Sites No         We reviewed her HEP and she was able to demo all HEP correctly. She has yellow , red and green band for home. She feels she has enough exercises and is doing them daily                           PT Short Term Goals - 06/20/14 1642    PT SHORT TERM GOAL #2   Title She will report pain improved 30 % or more with work tasks   Status Not Met   PT SHORT TERM GOAL #3   Title Active LT shoulder range equal LT   Status Partially Met  PT Long Term Goals - 06/20/14 1642    PT LONG TERM GOAL #1   Title she will  decrease pain 50% or more with work tasks   Status Not Met   PT LONG TERM GOAL #2   Title She will be able to do all HEP issued as of last visit.    Status Achieved   PT LONG TERM GOAL #3   Title she will be able to sleep in bed.    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 she feels she has enough exercise and understands better the tactivity she needs to do to maximize her function    PT Next Visit Plan Discharge today to HEP   Consulted and Agree with Plan of Care Patient        Problem List Patient Active Problem List   Diagnosis Date Noted  . Right knee DJD 11/09/2013    [name] PT 06/20/2014, 4:45 PM  [facility] [address] [place], [place], [address] Phone: [phone]   Fax:  [phone]     PHYSICAL THERAPY DISCHARGE SUMMARY  Visits from Start of Care: 5  Current functional level related to goals / functional outcomes:   See above Remaining deficits:  Pain limts use of LT arm Education / Equipment: HEP  Plan: Patient agrees to discharge.  Patient goals were partially met. Patient is being discharged due to being pleased with the current functional level.  ?????   And feels pain is not going to improve significantly due to tears

## 2014-06-23 ENCOUNTER — Ambulatory Visit: Payer: PRIVATE HEALTH INSURANCE | Admitting: Physical Therapy

## 2014-09-06 ENCOUNTER — Ambulatory Visit (HOSPITAL_COMMUNITY)
Admission: RE | Admit: 2014-09-06 | Discharge: 2014-09-06 | Disposition: A | Discharge status: Home / Self Care | Payer: 59 | Source: Ambulatory Visit | Attending: Family Medicine | Admitting: Family Medicine

## 2014-09-06 DIAGNOSIS — Z1231 Encounter for screening mammogram for malignant neoplasm of breast: Secondary | ICD-10-CM | POA: Diagnosis not present

## 2014-10-26 ENCOUNTER — Other Ambulatory Visit: Payer: Self-pay | Admitting: Orthopaedic Surgery

## 2014-11-14 ENCOUNTER — Encounter (HOSPITAL_BASED_OUTPATIENT_CLINIC_OR_DEPARTMENT_OTHER): Payer: Self-pay | Admitting: *Deleted

## 2014-11-16 NOTE — H&P (Signed)
Lacey [name] is an 59 y.o. female.   Chief Complaint: Left shoulder pain HPI: Lacey [name] is here to see [place] for the first time in many months. She's been managed by Dr Durward Fortes for a left shoulder problem. This is a Designer, jewellery compensated injury. They have contemplated some surgery. She has pain on top of her shoulder and a pop that she feels when she twists her arm around. The pain is located on the anterior aspect. She has some difficulty resting at night. She has been through some conservative measures to no avail. 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   MRI:  I reviewed an MRI scan films and report of a study done at [facility] on 04/27/14. This shows some partial-thickness tearing of the subscapularis with dislocation of the long head of the biceps tendon. It also shows some partial-thickness tearing of the distal supraspinatus. She does have some AC degenerative change.  Past Medical History  Diagnosis Date  . GERD (gastroesophageal reflux disease)   . Anxiety   . Arthritis   . Rotator cuff tear     Past Surgical History  Procedure Laterality Date  . Orif ankle fracture  2009    left  . Hardware removal  2010    left ankle  . Upper gastrointestinal endoscopy    . Ercp  2012  . Total abdominal hysterectomy  2003    rt ovary,  . Anterior and posterior repair  2003    with the TAH  . Knee arthroscopy      right and left  . Tonsillectomy    . Shoulder open rotator cuff repair  11/05/2011    Procedure: ROTATOR CUFF REPAIR SHOULDER OPEN;  Surgeon: Hessie Dibble, MD;  Location: [place];  Service: Orthopedics;  Laterality: Right;  RIGHT SHOULDER OPEN ROTATOR CUFF REPAIR  . Bicept tenodesis  11/05/2011    Procedure: BICEPS TENODESIS;  Surgeon: Hessie Dibble, MD;  Location: [place];  Service: Orthopedics;  Laterality: Right;   TENODESIS  AND  ACRMIOCLAVICULAR RESECTION   . Total knee arthroplasty Right  11/09/2013    Procedure: TOTAL KNEE ARTHROPLASTY;  Surgeon: Hessie Dibble, MD;  Location: [place];  Service: Orthopedics;  Laterality: Right;  . Joint replacement Right 2015    History reviewed. No pertinent family history. Social History:  reports that she has never smoked. She does not have any smokeless tobacco history on file. She reports that she drinks alcohol. Her drug history is not on file.  Allergies:  Allergies  Allergen Reactions  . Morphine And Related Other (See Comments)    "feels crazy"    No prescriptions prior to admission    No results found for this or any previous visit (from the past 48 hour(s)). No results found.  Review of Systems  Musculoskeletal: Positive for joint pain.       Left shoulder  All other systems reviewed and are negative.   Height 5\' 4"  (1.626 m), weight 71.668 kg (158 lb). Physical Exam  Constitutional: She is oriented to person, place, and time. She appears well-developed and well-nourished.  HENT:  Head: Normocephalic and atraumatic.  Eyes: Pupils are equal, round, and reactive to light.  Neck: Normal range of motion.  Cardiovascular: Normal rate and regular rhythm.   Respiratory: Effort normal.  GI: Soft.  Musculoskeletal:  Left shoulder motion is fairly good. She has some pain to external rotation. Her strength is  good in both internal and external rotation but painful more so on external rotation resistance strength testing . She does have some significant secondary impingement pain. She also has a prominence at her [facility] joint and some pain to palpation there. Cervical motion does not cause any arm pain and there is no palpable lymphadenopathy.  Neurological: She is alert and oriented to person, place, and time.  Skin: Skin is warm and dry.  Psychiatric: She has a normal mood and affect. Her behavior is normal. Judgment and thought content normal.     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 She's been symptomatic for many months. She has pain and inability to use her arm and to rest. I've reviewed risk of anesthesia, infection, neurovascular injury related to a combination of arthroscopic and open procedures. I think on this side we can treat her with an arthroscopic AC resection and acromioplasty and likely a debridement of some partial-thickness cuff tears though she might require a repair. I would then perform an open biceps tenodesis. I reviewed a month in a sling and some significant therapy. She'll be out of work for probably 3 months.   Wing Gfeller, Larwance Sachs 11/16/2014, 1:42 PM

## 2014-11-18 ENCOUNTER — Ambulatory Visit (HOSPITAL_BASED_OUTPATIENT_CLINIC_OR_DEPARTMENT_OTHER): Payer: PRIVATE HEALTH INSURANCE | Admitting: Certified Registered"

## 2014-11-18 ENCOUNTER — Ambulatory Visit (HOSPITAL_BASED_OUTPATIENT_CLINIC_OR_DEPARTMENT_OTHER)
Admission: RE | Admit: 2014-11-18 | Discharge: 2014-11-18 | Disposition: A | Discharge status: Home / Self Care | Payer: PRIVATE HEALTH INSURANCE | Source: Ambulatory Visit | Attending: Orthopaedic Surgery | Admitting: Orthopaedic Surgery

## 2014-11-18 ENCOUNTER — Encounter (HOSPITAL_BASED_OUTPATIENT_CLINIC_OR_DEPARTMENT_OTHER)
Admission: RE | Disposition: A | Discharge status: Home / Self Care | Payer: Self-pay | Source: Ambulatory Visit | Attending: Orthopaedic Surgery

## 2014-11-18 ENCOUNTER — Encounter (HOSPITAL_BASED_OUTPATIENT_CLINIC_OR_DEPARTMENT_OTHER): Payer: Self-pay | Admitting: Certified Registered"

## 2014-11-18 DIAGNOSIS — M19012 Primary osteoarthritis, left shoulder: Secondary | ICD-10-CM | POA: Diagnosis not present

## 2014-11-18 DIAGNOSIS — M7542 Impingement syndrome of left shoulder: Secondary | ICD-10-CM | POA: Insufficient documentation

## 2014-11-18 DIAGNOSIS — M75112 Incomplete rotator cuff tear or rupture of left shoulder, not specified as traumatic: Secondary | ICD-10-CM | POA: Diagnosis present

## 2014-11-18 HISTORY — PX: SHOULDER ARTHROSCOPY WITH ROTATOR CUFF REPAIR AND OPEN BICEPS TENODESIS: SHX6677

## 2014-11-18 HISTORY — DX: Unspecified osteoarthritis, unspecified site: M19.90

## 2014-11-18 HISTORY — DX: Anxiety disorder, unspecified: F41.9

## 2014-11-18 HISTORY — DX: Unspecified rotator cuff tear or rupture of unspecified shoulder, not specified as traumatic: M75.100

## 2014-11-18 SURGERY — SHOULDER ARTHROSCOPY WITH SUBACROMIAL DECOMPRESSION AND DISTAL CLAVICLE EXCISION
Anesthesia: Regional | Site: Shoulder | Laterality: Left

## 2014-11-18 MED ORDER — ARTIFICIAL TEARS OP OINT
TOPICAL_OINTMENT | OPHTHALMIC | Status: AC
  Filled 2014-11-18: qty 3.5

## 2014-11-18 MED ORDER — LACTATED RINGERS IV SOLN
INTRAVENOUS | Status: DC
  Administered 2014-11-18 (×2): via INTRAVENOUS

## 2014-11-18 MED ORDER — PROPOFOL 10 MG/ML IV BOLUS
INTRAVENOUS | Status: AC
  Filled 2014-11-18: qty 20

## 2014-11-18 MED ORDER — LIDOCAINE HCL (CARDIAC) 20 MG/ML IV SOLN
INTRAVENOUS | Status: AC
  Filled 2014-11-18: qty 5

## 2014-11-18 MED ORDER — MIDAZOLAM HCL 2 MG/2ML IJ SOLN
1.0000 mg | INTRAMUSCULAR | Status: DC | PRN
  Administered 2014-11-18 (×2): 2 mg via INTRAVENOUS

## 2014-11-18 MED ORDER — CHLORHEXIDINE GLUCONATE 4 % EX LIQD: 60.0000 mL | Freq: Once | CUTANEOUS | Status: DC

## 2014-11-18 MED ORDER — HYDROMORPHONE HCL 1 MG/ML IJ SOLN
0.2500 mg | INTRAMUSCULAR | Status: DC | PRN
Start: 2014-11-18 — End: 2014-11-18

## 2014-11-18 MED ORDER — LACTATED RINGERS IV SOLN: INTRAVENOUS | Status: DC

## 2014-11-18 MED ORDER — LIDOCAINE HCL 4 % MT SOLN
OROMUCOSAL | Status: DC | PRN
  Administered 2014-11-18: 2 mL via TOPICAL

## 2014-11-18 MED ORDER — HYDROCODONE-ACETAMINOPHEN 5-325 MG PO TABS: 1.0000 | ORAL_TABLET | Freq: Four times a day (QID) | ORAL | Status: DC | PRN

## 2014-11-18 MED ORDER — SCOPOLAMINE 1 MG/3DAYS TD PT72: 1.0000 | MEDICATED_PATCH | Freq: Once | TRANSDERMAL | Status: DC | PRN

## 2014-11-18 MED ORDER — OXYCODONE HCL 5 MG/5ML PO SOLN: 5.0000 mg | Freq: Once | ORAL | Status: DC | PRN

## 2014-11-18 MED ORDER — LIDOCAINE HCL (CARDIAC) 20 MG/ML IV SOLN
INTRAVENOUS | Status: DC | PRN
  Administered 2014-11-18: 80 mg via INTRAVENOUS

## 2014-11-18 MED ORDER — PROPOFOL 10 MG/ML IV BOLUS
INTRAVENOUS | Status: DC | PRN
  Administered 2014-11-18: 200 mg via INTRAVENOUS

## 2014-11-18 MED ORDER — DEXAMETHASONE SODIUM PHOSPHATE 10 MG/ML IJ SOLN
INTRAMUSCULAR | Status: AC
  Filled 2014-11-18: qty 1

## 2014-11-18 MED ORDER — ARTIFICIAL TEARS OP OINT
TOPICAL_OINTMENT | OPHTHALMIC | Status: DC | PRN
  Administered 2014-11-18: 1 via OPHTHALMIC

## 2014-11-18 MED ORDER — ALPRAZOLAM 0.5 MG PO TABS: 0.5000 mg | ORAL_TABLET | Freq: Three times a day (TID) | ORAL | Status: DC | PRN

## 2014-11-18 MED ORDER — CEFAZOLIN SODIUM-DEXTROSE 2-3 GM-% IV SOLR
2.0000 g | INTRAVENOUS | Status: AC
  Administered 2014-11-18: 2 g via INTRAVENOUS

## 2014-11-18 MED ORDER — ONDANSETRON HCL 4 MG/2ML IJ SOLN
INTRAMUSCULAR | Status: DC | PRN
  Administered 2014-11-18: 4 mg via INTRAVENOUS

## 2014-11-18 MED ORDER — MIDAZOLAM HCL 2 MG/2ML IJ SOLN
INTRAMUSCULAR | Status: AC
  Filled 2014-11-18: qty 2

## 2014-11-18 MED ORDER — BUPIVACAINE-EPINEPHRINE (PF) 0.5% -1:200000 IJ SOLN
INTRAMUSCULAR | Status: DC | PRN
  Administered 2014-11-18: 25 mL via PERINEURAL

## 2014-11-18 MED ORDER — MEPERIDINE HCL 25 MG/ML IJ SOLN: 6.2500 mg | INTRAMUSCULAR | Status: DC | PRN

## 2014-11-18 MED ORDER — MIDAZOLAM HCL 2 MG/2ML IJ SOLN
INTRAMUSCULAR | Status: AC
  Filled 2014-11-18: qty 4

## 2014-11-18 MED ORDER — METHYLPREDNISOLONE ACETATE 80 MG/ML IJ SUSP
INTRAMUSCULAR | Status: AC
  Filled 2014-11-18: qty 1

## 2014-11-18 MED ORDER — DEXAMETHASONE SODIUM PHOSPHATE 4 MG/ML IJ SOLN
INTRAMUSCULAR | Status: DC | PRN
  Administered 2014-11-18: 10 mg via INTRAVENOUS

## 2014-11-18 MED ORDER — CEFAZOLIN SODIUM-DEXTROSE 2-3 GM-% IV SOLR
INTRAVENOUS | Status: AC
  Filled 2014-11-18: qty 50

## 2014-11-18 MED ORDER — SUCCINYLCHOLINE CHLORIDE 20 MG/ML IJ SOLN
INTRAMUSCULAR | Status: DC | PRN
  Administered 2014-11-18: 100 mg via INTRAVENOUS

## 2014-11-18 MED ORDER — FENTANYL CITRATE (PF) 100 MCG/2ML IJ SOLN
50.0000 ug | INTRAMUSCULAR | Status: DC | PRN
  Administered 2014-11-18: 100 ug via INTRAVENOUS

## 2014-11-18 MED ORDER — GLYCOPYRROLATE 0.2 MG/ML IJ SOLN
0.2000 mg | Freq: Once | INTRAMUSCULAR | Status: DC | PRN
Start: 2014-11-18 — End: 2014-11-18

## 2014-11-18 MED ORDER — SODIUM CHLORIDE 0.9 % IR SOLN
Status: DC | PRN
  Administered 2014-11-18: 6000 mL

## 2014-11-18 MED ORDER — FENTANYL CITRATE (PF) 100 MCG/2ML IJ SOLN
INTRAMUSCULAR | Status: AC
  Filled 2014-11-18: qty 2

## 2014-11-18 MED ORDER — BUPIVACAINE-EPINEPHRINE (PF) 0.5% -1:200000 IJ SOLN
INTRAMUSCULAR | Status: AC
  Filled 2014-11-18: qty 30

## 2014-11-18 MED ORDER — OXYCODONE HCL 5 MG PO TABS: 5.0000 mg | ORAL_TABLET | Freq: Once | ORAL | Status: DC | PRN

## 2014-11-18 SURGICAL SUPPLY — 76 items
APL SKNCLS STERI-STRIP NONHPOA (GAUZE/BANDAGES/DRESSINGS) ×1
BENZOIN TINCTURE PRP APPL 2/3 (GAUZE/BANDAGES/DRESSINGS) ×2 IMPLANT
BLADE CUDA 5.5 (BLADE) IMPLANT
BLADE GREAT WHITE 4.2 (BLADE) ×2 IMPLANT
BLADE GREAT WHITE 4.2MM (BLADE) ×1
BLADE SURG 15 STRL LF DISP TIS (BLADE) IMPLANT
BLADE SURG 15 STRL SS (BLADE)
BUR VERTEX HOODED 4.5 (BURR) ×2 IMPLANT
CANNULA SHOULDER 7CM (CANNULA) ×3 IMPLANT
CANNULA TWIST IN 8.25X7CM (CANNULA) IMPLANT
CLOSURE WOUND 1/2 X4 (GAUZE/BANDAGES/DRESSINGS) ×1
DECANTER SPIKE VIAL GLASS SM (MISCELLANEOUS) IMPLANT
DRAPE STERI 35X30 U-POUCH (DRAPES) ×3 IMPLANT
DRAPE U-SHAPE 47X51 STRL (DRAPES) ×3 IMPLANT
DRAPE U-SHAPE 76X120 STRL (DRAPES) ×6 IMPLANT
DRSG EMULSION OIL 3X3 NADH (GAUZE/BANDAGES/DRESSINGS) ×3 IMPLANT
DRSG PAD ABDOMINAL 8X10 ST (GAUZE/BANDAGES/DRESSINGS) ×3 IMPLANT
DURAPREP 26ML APPLICATOR (WOUND CARE) ×3 IMPLANT
ELECT MENISCUS 165MM 90D (ELECTRODE) IMPLANT
ELECT REM PT RETURN 9FT ADLT (ELECTROSURGICAL) ×3
ELECTRODE REM PT RTRN 9FT ADLT (ELECTROSURGICAL) ×1 IMPLANT
GAUZE SPONGE 4X4 12PLY STRL (GAUZE/BANDAGES/DRESSINGS) ×3 IMPLANT
GLOVE BIO SURGEON STRL SZ8 (GLOVE) ×6 IMPLANT
GLOVE BIOGEL PI IND STRL 6.5 (GLOVE) IMPLANT
GLOVE BIOGEL PI IND STRL 8 (GLOVE) ×2 IMPLANT
GLOVE BIOGEL PI INDICATOR 6.5 (GLOVE) ×2
GLOVE BIOGEL PI INDICATOR 8 (GLOVE) ×4
GLOVE ECLIPSE 6.5 STRL STRAW (GLOVE) ×2 IMPLANT
GLOVE EXAM NITRILE EXT CUFF MD (GLOVE) ×2 IMPLANT
GOWN STRL REUS W/ TWL LRG LVL3 (GOWN DISPOSABLE) ×1 IMPLANT
GOWN STRL REUS W/ TWL XL LVL3 (GOWN DISPOSABLE) ×2 IMPLANT
GOWN STRL REUS W/TWL LRG LVL3 (GOWN DISPOSABLE) ×3
GOWN STRL REUS W/TWL XL LVL3 (GOWN DISPOSABLE) ×6
KIT BIO-TENODESIS 3X8 DISP (MISCELLANEOUS)
KIT INSRT BABSR STRL DISP BTN (MISCELLANEOUS) IMPLANT
LIQUID BAND (GAUZE/BANDAGES/DRESSINGS) IMPLANT
MANIFOLD NEPTUNE II (INSTRUMENTS) ×3 IMPLANT
NDL SCORPION MULTI FIRE (NEEDLE) IMPLANT
NDL SUT 6 .5 CRC .975X.05 MAYO (NEEDLE) IMPLANT
NEEDLE MAYO TAPER (NEEDLE)
NEEDLE SCORPION MULTI FIRE (NEEDLE) IMPLANT
NS IRRIG 1000ML POUR BTL (IV SOLUTION) IMPLANT
PACK ARTHROSCOPY DSU (CUSTOM PROCEDURE TRAY) ×3 IMPLANT
PACK BASIN DAY SURGERY FS (CUSTOM PROCEDURE TRAY) ×3 IMPLANT
PASSER SUT SWANSON 36MM LOOP (INSTRUMENTS) IMPLANT
PENCIL BUTTON HOLSTER BLD 10FT (ELECTRODE) ×2 IMPLANT
SET ARTHROSCOPY TUBING (MISCELLANEOUS) ×3
SET ARTHROSCOPY TUBING LN (MISCELLANEOUS) ×1 IMPLANT
SHEET MEDIUM DRAPE 40X70 STRL (DRAPES) ×3 IMPLANT
SLEEVE SCD COMPRESS KNEE MED (MISCELLANEOUS) ×2 IMPLANT
SLING ARM LRG ADULT FOAM STRAP (SOFTGOODS) IMPLANT
SLING ARM MED ADULT FOAM STRAP (SOFTGOODS) ×2 IMPLANT
SLING ARM SM FOAM STRAP (SOFTGOODS) IMPLANT
SLING ARM XL FOAM STRAP (SOFTGOODS) IMPLANT
SPONGE LAP 4X18 X RAY DECT (DISPOSABLE) ×4 IMPLANT
STRIP CLOSURE SKIN 1/2X4 (GAUZE/BANDAGES/DRESSINGS) ×1 IMPLANT
SUCTION FRAZIER TIP 10 FR DISP (SUCTIONS) IMPLANT
SUT ETHIBOND 2 OS 4 DA (SUTURE) IMPLANT
SUT ETHILON 3 0 PS 1 (SUTURE) ×3 IMPLANT
SUT FIBERWIRE #2 38 T-5 BLUE (SUTURE)
SUT MNCRL AB 4-0 PS2 18 (SUTURE) ×2 IMPLANT
SUT PDS AB 2-0 CT2 27 (SUTURE) IMPLANT
SUT VIC AB 0 SH 27 (SUTURE) ×2 IMPLANT
SUT VIC AB 1 CT1 27 (SUTURE) ×3
SUT VIC AB 1 CT1 27XBRD ANBCTR (SUTURE) IMPLANT
SUT VIC AB 2-0 SH 27 (SUTURE) ×3
SUT VIC AB 2-0 SH 27XBRD (SUTURE) IMPLANT
SUT VICRYL 4-0 PS2 18IN ABS (SUTURE) IMPLANT
SUTURE FIBERWR #2 38 T-5 BLUE (SUTURE) IMPLANT
SYR BULB 3OZ (MISCELLANEOUS) IMPLANT
TOWEL OR 17X24 6PK STRL BLUE (TOWEL DISPOSABLE) ×3 IMPLANT
TOWEL OR NON WOVEN STRL DISP B (DISPOSABLE) ×3 IMPLANT
Tenolok tenodesis anchor 6mm (Anchor) ×2 IMPLANT
WAND STAR VAC 90 (SURGICAL WAND) ×3 IMPLANT
WATER STERILE IRR 1000ML POUR (IV SOLUTION) ×3 IMPLANT
YANKAUER SUCT BULB TIP NO VENT (SUCTIONS) ×2 IMPLANT

## 2014-11-18 NOTE — Discharge Instructions (Signed)
° ° °  Regional Anesthesia Blocks ° °1. Numbness or the inability to move the "blocked" extremity may last from 3-48 hours after placement. The length of time depends on the medication injected and your individual response to the medication. If the numbness is not going away after 48 hours, call your surgeon. ° °2. The extremity that is blocked will need to be protected until the numbness is gone and the  Strength has returned. Because you cannot feel it, you will need to take extra care to avoid injury. Because it may be weak, you may have difficulty moving it or using it. You may not know what position it is in without looking at it while the block is in effect. ° °3. For blocks in the legs and feet, returning to weight bearing and walking needs to be done carefully. You will need to wait until the numbness is entirely gone and the strength has returned. You should be able to move your leg and foot normally before you try and bear weight or walk. You will need someone to be with you when you first try to ensure you do not fall and possibly risk injury. ° °4. Bruising and tenderness at the needle site are common side effects and will resolve in a few days. ° °5. Persistent numbness or new problems with movement should be communicated to the surgeon or the [facility] ([phone])/ [facility] ([phone]). ° ° ° °Post Anesthesia Home Care Instructions ° °Activity: °Get plenty of rest for the remainder of the day. A responsible adult should stay with you for 24 hours following the procedure.  °For the next 24 hours, DO NOT: °-Drive a car °-Operate machinery °-Drink alcoholic beverages °-Take any medication unless instructed by your physician °-Make any legal decisions or sign important papers. ° °Meals: °Start with liquid foods such as gelatin or soup. Progress to regular foods as tolerated. Avoid greasy, spicy, heavy foods. If nausea and/or vomiting occur, drink only clear liquids until  the nausea and/or vomiting subsides. Call your physician if vomiting continues. ° °Special Instructions/Symptoms: °Your throat may feel dry or sore from the anesthesia or the breathing tube placed in your throat during surgery. If this causes discomfort, gargle with warm salt water. The discomfort should disappear within 24 hours. ° °If you had a scopolamine patch placed behind your ear for the management of post- operative nausea and/or vomiting: ° °1. The medication in the patch is effective for 72 hours, after which it should be removed.  Wrap patch in a tissue and discard in the trash. Wash hands thoroughly with soap and water. °2. You may remove the patch earlier than 72 hours if you experience unpleasant side effects which may include dry mouth, dizziness or visual disturbances. °3. Avoid touching the patch. Wash your hands with soap and water after contact with the patch. °  ° °

## 2014-11-18 NOTE — Anesthesia Postprocedure Evaluation (Signed)
  Anesthesia Post-op Note  Patient: Lacey [name]  Procedure(s) Performed: Procedure(s): SHOULDER ARTHROSCOPY WITH DEBRIDEMENT, ACROMIOPLASTY AND DISTAL CLAVICLE EXCISION (Left) SHOULDER ARTHROSCOPY WITH ROTATOR CUFF REPAIR AND OPEN BICEPS TENODESIS (Left)  Patient Location: PACU  Anesthesia Type: General, Regional   Level of Consciousness: awake, alert  and oriented  Airway and Oxygen Therapy: Patient Spontanous Breathing  Post-op Pain: none  Post-op Assessment: Post-op Vital signs reviewed  Post-op Vital Signs: Reviewed  Last Vitals:  Filed Vitals:   11/18/14 1530  BP: 123/80  Pulse: 57  Temp:   Resp: 15    Complications: No apparent anesthesia complications

## 2014-11-18 NOTE — Transfer of Care (Signed)
Immediate Anesthesia Transfer of Care Note  Patient: Lacey [name]  Procedure(s) Performed: Procedure(s): LEFT SHOULDER ARTHROSCOPY WITH OPEN BICEPS TENODESIS (Left)  Patient Location: PACU  Anesthesia Type:GA combined with regional for post-op pain  Level of Consciousness: awake, sedated and responds to stimulation  Airway & Oxygen Therapy: Patient Spontanous Breathing and Patient connected to face mask oxygen  Post-op Assessment: Report given to RN, Post -op Vital signs reviewed and stable and Patient moving all extremities  Post vital signs: Reviewed and stable  Last Vitals:  Filed Vitals:   11/18/14 1215  BP: 101/75  Pulse: 65  Temp:   Resp: 20    Complications: No apparent anesthesia complications

## 2014-11-18 NOTE — Anesthesia Preprocedure Evaluation (Signed)
Anesthesia Evaluation  Patient identified by MRN, date of birth, ID band Patient awake    Reviewed: Allergy & Precautions, NPO status , Patient's Chart, lab work & pertinent test results  Airway Mallampati: I  TM Distance: >3 FB Neck ROM: Full    Dental  (+) Teeth Intact, Dental Advisory Given   Pulmonary    breath sounds clear to auscultation       Cardiovascular  Rhythm:Regular Rate:Normal     Neuro/Psych    GI/Hepatic GERD  Medicated and Controlled,  Endo/Other    Renal/GU      Musculoskeletal   Abdominal   Peds  Hematology   Anesthesia Other Findings   Reproductive/Obstetrics                             Anesthesia Physical Anesthesia Plan  ASA: I  Anesthesia Plan: General and Regional   Post-op Pain Management:    Induction: Intravenous  Airway Management Planned: Oral ETT  Additional Equipment:   Intra-op Plan:   Post-operative Plan: Extubation in OR  Informed Consent: I have reviewed the patients History and Physical, chart, labs and discussed the procedure including the risks, benefits and alternatives for the proposed anesthesia with the patient or authorized representative who has indicated his/her understanding and acceptance.   Dental advisory given  Plan Discussed with: CRNA, Anesthesiologist and Surgeon  Anesthesia Plan Comments:         Anesthesia Quick Evaluation

## 2014-11-18 NOTE — Progress Notes (Signed)
Assisted Dr. Crews with left, ultrasound guided, interscalene  block. Side rails up, monitors on throughout procedure. See vital signs in flow sheet. Tolerated Procedure well. 

## 2014-11-18 NOTE — Interval H&P Note (Signed)
OK for surgery PD 

## 2014-11-18 NOTE — Op Note (Signed)
#  [id number] 

## 2014-11-18 NOTE — Anesthesia Procedure Notes (Addendum)
Anesthesia Regional Block:  Interscalene brachial plexus block  Pre-Anesthetic Checklist: ,, timeout performed, Correct Patient, Correct Site, Correct Laterality, Correct Procedure, Correct Position, site marked, Risks and benefits discussed,  Surgical consent,  Pre-op evaluation,  At surgeon's request and post-op pain management  Laterality: Left and Upper  Prep: chloraprep       Needles:  Injection technique: Single-shot  Needle Type: Echogenic Needle     Needle Length: 5cm 5 cm Needle Gauge: 21 and 21 G    Additional Needles:  Procedures: ultrasound guided (picture in chart) Interscalene brachial plexus block Narrative:  Start time: 11/18/2014 11:38 AM End time: 11/18/2014 11:43 AM Injection made incrementally with aspirations every 5 mL.  Performed by: Personally  Anesthesiologist: CREWS, DAVID   Procedure Name: Intubation Date/Time: 11/18/2014 1:27 PM Performed by: Baxter Flattery Pre-anesthesia Checklist: Patient identified, Emergency Drugs available, Suction available and Patient being monitored Patient Re-evaluated:Patient Re-evaluated prior to inductionOxygen Delivery Method: Circle System Utilized Preoxygenation: Pre-oxygenation with 100% oxygen Intubation Type: IV induction Ventilation: Mask ventilation without difficulty Laryngoscope Size: Miller and 2 Grade View: Grade I Tube type: Oral Tube size: 6.5 mm Number of attempts: 1 Airway Equipment and Method: Stylet and LTA kit utilized Placement Confirmation: ETT inserted through vocal cords under direct vision,  positive ETCO2 and breath sounds checked- equal and bilateral Secured at: 21 cm Tube secured with: Tape Dental Injury: Teeth and Oropharynx as per pre-operative assessment       Left IS block image

## 2014-11-21 ENCOUNTER — Encounter (HOSPITAL_BASED_OUTPATIENT_CLINIC_OR_DEPARTMENT_OTHER): Payer: Self-pay | Admitting: Orthopaedic Surgery

## 2014-11-21 NOTE — Op Note (Signed)
NAMELE, FERRAZ               ACCOUNT NO.:  [id number]  MEDICAL RECORD NO.:  [id number]  LOCATION:                               FACILITY:  [facility]  PHYSICIAN:  Monico Blitz. Eliezer Khawaja, M.D.DATE OF BIRTH:  [birth year]  DATE OF PROCEDURE:  11/18/2014 DATE OF DISCHARGE:  11/18/2014                              OPERATIVE REPORT   PREOPERATIVE DIAGNOSES: 1. Left shoulder biceps dislocation. 2. Left shoulder partial rotator cuff tear. 3. Left shoulder impingement. 4. Left shoulder AC pain.  POSTOPERATIVE DIAGNOSES: 1. Left shoulder biceps dislocation. 2. Left shoulder partial rotator cuff tear. 3. Left shoulder impingement. 4. Left shoulder AC pain.  PROCEDURE: 1. Left shoulder open biceps tenodesis. 2. Left shoulder open rotator cuff repair. 3. Left shoulder arthroscopic acromioplasty. 4. Left shoulder arthroscopic AC resection.  ANESTHESIA:  General and block.  ATTENDING SURGEON:  Monico Blitz. Rhona Raider, M.D.  ASSISTANT:  Loni Dolly, PA.  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 This has persisted despite multiple conservative interventions.  By MRI scan, she has partial-thickness cuff tears with dislocation of her biceps tendon and AC degenerative change.  She has pain which limits really use of her arm.  She has a very painful pop if she rotates her arm.  She is offered operative intervention to consist of a combination of open and arthroscopic procedures somewhat similar to what she had on the opposite shoulder several years back.  Informed operative consent was obtained after discussion of possible complications including reaction to anesthesia, infection, neurovascular injury.  SUMMARY OF FINDINGS AND PROCEDURE:  Under general anesthesia and a block, a combination of open and arthroscopic procedure was performed. Had arthroscopy in the glenohumeral joint.  She has had no  degenerative change.  The biceps tendon was dislocated and extremely degenerative and swollen.  I elected to perform initially a tenolysis followed by the open tenodesis.  The rotator cuff had some partial thickness tearing seen from below.  I examined the subscapularis and 80% of this tendon was intact.  I elected not to perform a repair there.  In the subacromial space, she had a moderately prominent subacromial morphology addressed with acromioplasty.  She also had bone-on-bone contact to the [facility] joint addressed with resection of 1 cm of the distal clavicle.  At her rotator cuff, she did have a small insertional tear which was full thickness involving a portion of what I presume was the supraspinatus. I then made an incision and performed the open biceps tenodesis and also an open side-to-side rotator cuff repair.  She was closed primarily and discharged home.  DESCRIPTION OF PROCEDURE:  The patient was taken to operating suite where general anesthetic was applied without difficulty.  She was also given a block in the pre-anesthesia area.  She was positioned in a beach- chair position and prepped and draped in normal sterile fashion.  After the administration of IV Kefzol and an appropriate time out, an arthroscopy of the left shoulder was performed through total of 3 portals.  Findings were as noted above and arthroscopic procedure consisted of the biceps tenolysis, followed by an acromioplasty,  which was done with a bur in the lateral position followed by transfer of the bur to the posterior position.  Also performed an arthroscopic AC resection, removing a centimeter of distal clavicle decompressing the AC joint.  The rotator cuff was examined and 1 small full-thickness tear was seen.  I thought I could address this in an open fashion side-to- side at the same time that I performed biceps tenodesis.  Arthroscopic equipment was removed.  I made a small anterolateral incision of  the shoulder with dissection down through a split in the deltoid through the bicipital groove.  The tendon was retrieved here and was then tenodesed to a drill hole just below the bicipital groove using the TunneLoc device by [employer].  I trimmed the excess tendon and this gave [place] a nice smooth repair.  I over sewed with some Vicryl side-to-side with some soft tissues over this area.  I then extended the split up towards the subacromial space, and could easily see the small rotator cuff tear.  I elected to simply repair this side-to-side with Vicryl and did not feel that a suture anchor was necessary.  This wound was then thoroughly irrigated followed by reapproximation of the deltoid fascia with 0 Vicryl interrupted fashion.  Subcutaneous tissues reapproximated with 2- 0 undyed Vicryl and skin was closed with a subcuticular stitch and some Steri-Strips.  The arthroscopic portals reapproximated with nylon. Adaptic was applied to these various wounds, followed by dry gauze and tape.  Loni Dolly assisted throughout and was invaluable to the completion of the case mostly that he passed instruments to make this possible in an arthroscopic fashion.  He also closed the open portion simultaneously to help minimize OR time.  DISPOSITION:  The patient was extubated in the operating room and taken to recovery room in stable condition.  She was to go home same-day and follow up in the office closely.  I will contact her by phone tonight.     Monico Blitz Rhona Raider, M.D.     PGD/MEDQ  D:  11/18/2014  T:  11/19/2014  Job:  [id number]

## 2015-01-26 MED FILL — HYDROCODON-APAP 5-325: 5-325 | 13 days supply | Qty: 40 | Fill #0

## 2015-02-17 MED FILL — LOTEMAX 0.5% GEL: 0.5 | 30 days supply | Qty: 5 | Fill #1

## 2015-02-23 ENCOUNTER — Ambulatory Visit (INDEPENDENT_AMBULATORY_CARE_PROVIDER_SITE_OTHER): Payer: 59 | Admitting: Family Medicine

## 2015-02-23 VITALS — BP 118/80 | HR 73 | Temp 98.4°F | Resp 16 | Ht 64.0 in | Wt 146.0 lb

## 2015-02-23 DIAGNOSIS — F419 Anxiety disorder, unspecified: Principal | ICD-10-CM

## 2015-02-23 DIAGNOSIS — G4700 Insomnia, unspecified: Secondary | ICD-10-CM | POA: Diagnosis not present

## 2015-02-23 DIAGNOSIS — F341 Dysthymic disorder: Secondary | ICD-10-CM | POA: Diagnosis not present

## 2015-02-23 DIAGNOSIS — F329 Major depressive disorder, single episode, unspecified: Secondary | ICD-10-CM

## 2015-02-23 DIAGNOSIS — F418 Other specified anxiety disorders: Secondary | ICD-10-CM

## 2015-02-23 MED ORDER — ALPRAZOLAM 0.5 MG PO TABS: 0.5000 mg | ORAL_TABLET | Freq: Two times a day (BID) | ORAL | Status: DC | PRN

## 2015-02-23 MED ORDER — DULOXETINE HCL 20 MG PO CPEP: 20.0000 mg | ORAL_CAPSULE | Freq: Every day | ORAL | Status: DC

## 2015-02-23 NOTE — Progress Notes (Signed)
Subjective:    Patient ID: Lacey [name], female    DOB: [birth year], 60 y.o.   MRN: [id number]  02/23/2015  Stress   HPI This 60 y.o. female presents for evaluation of stress, insomnia.  Suffered R shoulder and R knee issues; s/p TKR; then tore L rotator cuff injury upon return to work.  Functioning fine.  Overwhelmed and anxious.  Exercises.  Xray tech.  Walks all day long.  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  Son in [employer] school. Took Zoloft and Wellbutrin separately; took Zoloft first and did not like it.  Cannot tolerate OCPs; takes Celebrex sparingly.  Has norco for pain.  Dr. Geoffry Paradise recommended.  Likes Xanax.   No Xanax now.  Got Xanax during TKR; gave Morphine which caused agitation.  Twice weekly Xanax 0.5mg .  Wellbutrin caused increasing agitation.  Zoloft 50mg  made feel zombie.  Very sensitive to medication.  Facing knee surgery.  No SI.  Went to Bible study last night which is helpful.  Got involved in Sunday school which is helpful.  [place].  Father 17yo; mom is 36yo.  Always on the road.Takes 2 Benadryl at nighttime due to risk of dementia.  Tylenol PM is not as effective.  Gone to counseling in psat; last time through [facility] ten years ago.  Trial of Wellbutrin ten years ago.  Exercises daily.  Husband gives no attanetion.Full time.  Tried Lexapro but did not like effects; felt blurry.    PCP:  Lattie Haw Miller/[facility]  [place] Review of Systems  Constitutional: Negative for fever, chills, diaphoresis and fatigue.  Eyes: Negative for visual disturbance.  Respiratory: Negative for cough and shortness of breath.   Cardiovascular: Negative for chest pain, palpitations and leg swelling.  Gastrointestinal: Negative for nausea, vomiting, abdominal pain, diarrhea and constipation.  Endocrine: Negative for cold intolerance, heat intolerance, polydipsia, polyphagia and polyuria.  Neurological: Negative for dizziness, tremors, seizures, syncope,  facial asymmetry, speech difficulty, weakness, light-headedness, numbness and headaches.    Past Medical History  Diagnosis Date  . GERD (gastroesophageal reflux disease)   . Anxiety   . Arthritis   . Rotator cuff tear    Past Surgical History  Procedure Laterality Date  . Orif ankle fracture  2009    left  . Hardware removal  2010    left ankle  . Upper gastrointestinal endoscopy    . Ercp  2012  . Total abdominal hysterectomy  2003    rt ovary,  . Anterior and posterior repair  2003    with the TAH  . Knee arthroscopy      right and left  . Tonsillectomy    . Shoulder open rotator cuff repair  11/05/2011    Procedure: ROTATOR CUFF REPAIR SHOULDER OPEN;  Surgeon: Hessie Dibble, MD;  Location: [facility];  Service: Orthopedics;  Laterality: Right;  RIGHT SHOULDER OPEN ROTATOR CUFF REPAIR  . Bicept tenodesis  11/05/2011    Procedure: BICEPS TENODESIS;  Surgeon: Hessie Dibble, MD;  Location: [facility];  Service: Orthopedics;  Laterality: Right;   TENODESIS  AND  ACRMIOCLAVICULAR RESECTION   . Total knee arthroplasty Right 11/09/2013    Procedure: TOTAL KNEE ARTHROPLASTY;  Surgeon: Hessie Dibble, MD;  Location: [facility];  Service: Orthopedics;  Laterality: Right;  . Joint replacement Right 2015  . Shoulder arthroscopy with rotator cuff repair and open biceps tenodesis Left 11/18/2014    Procedure: SHOULDER ARTHROSCOPY WITH  ROTATOR CUFF REPAIR AND OPEN BICEPS TENODESIS;  Surgeon: Melrose Nakayama, MD;  Location: [facility];  Service: Orthopedics;  Laterality: Left;   Allergies  Allergen Reactions  . Morphine And Related Other (See Comments)    "feels crazy"    Social History   Social History  . Marital Status: Married    Spouse Name: N/A  . Number of Children: N/A  . Years of Education: N/A   Occupational History  . Not on file.   Social History Main Topics  . Smoking status: Never Smoker   . Smokeless tobacco: Never  Used  . Alcohol Use: No     Comment: wine daily  . Drug Use: No  . Sexual Activity: Yes    Birth Control/ Protection: Surgical   Other Topics Concern  . Not on file   Social History Narrative   Family History  Problem Relation Age of Onset  . Depression Mother   . Hypertension Mother   . Diabetes Father        Objective:    BP 118/80 mmHg  Pulse 73  Temp(Src) 98.4 F (36.9 C) (Oral)  Resp 16  Ht 5\' 4"  (1.626 m)  Wt 146 lb (66.225 kg)  BMI 25.05 kg/m2  SpO2 98% Physical Exam  Constitutional: She is oriented to person, place, and time. She appears well-developed and well-nourished. No distress.  HENT:  Head: Normocephalic and atraumatic.  Right Ear: External ear normal.  Left Ear: External ear normal.  Nose: Nose normal.  Mouth/Throat: Oropharynx is clear and moist.  Eyes: Conjunctivae and EOM are normal. Pupils are equal, round, and reactive to light.  Neck: Normal range of motion. Neck supple. Carotid bruit is not present. No thyromegaly present.  Cardiovascular: Normal rate, regular rhythm, normal heart sounds and intact distal pulses.  Exam reveals no gallop and no friction rub.   No murmur heard. Pulmonary/Chest: Effort normal and breath sounds normal. She has no wheezes. She has no rales.  Abdominal: Soft. Bowel sounds are normal. She exhibits no distension and no mass. There is no tenderness. There is no rebound and no guarding.  Lymphadenopathy:    She has no cervical adenopathy.  Neurological: She is alert and oriented to person, place, and time. No cranial nerve deficit.  Skin: Skin is warm and dry. No rash noted. She is not diaphoretic. No erythema. No pallor.  Psychiatric: She has a normal mood and affect. Her behavior is normal.        Assessment & Plan:   1. Anxiety and depression   2. Insomnia    -New. Uncontrolled. -Rx for Cymbalta 20mg  and Xanax 0.5mg  provided.   Orders Placed This Encounter  Procedures  . CBC with Differential/Platelet    . TSH  . Vitamin B12  . VITAMIN D 25 Hydroxy (Vit-D Deficiency, Fractures)  . T4, free   Meds ordered this encounter  Medications  . Celecoxib (CELEBREX PO)    Sig: Take by mouth.  . DULoxetine (CYMBALTA) 20 MG capsule    Sig: Take 1 capsule (20 mg total) by mouth daily.    Dispense:  30 capsule    Refill:  2  . ALPRAZolam (XANAX) 0.5 MG tablet    Sig: Take 1 tablet (0.5 mg total) by mouth 2 (two) times daily as needed for anxiety or sleep.    Dispense:  30 tablet    Refill:  2    No Follow-up on file.    Etana Beets Elayne Guerin, M.D. [facility] [address] [place], [place]  [address] [phone] phone [phone] fax

## 2015-02-23 NOTE — Patient Instructions (Signed)
Generalized Anxiety Disorder Generalized anxiety disorder (GAD) is a mental disorder. It interferes with life functions, including relationships, work, and school. GAD is different from normal anxiety, which everyone experiences at some point in their lives in response to specific life events and activities. Normal anxiety actually helps us prepare for and get through these life events and activities. Normal anxiety goes away after the event or activity is over.  GAD causes anxiety that is not necessarily related to specific events or activities. It also causes excess anxiety in proportion to specific events or activities. The anxiety associated with GAD is also difficult to control. GAD can vary from mild to severe. People with severe GAD can have intense waves of anxiety with physical symptoms (panic attacks).  SYMPTOMS The anxiety and worry associated with GAD are difficult to control. This anxiety and worry are related to many life events and activities and also occur more days than not for 6 months or longer. People with GAD also have three or more of the following symptoms (one or more in children):  Restlessness.   Fatigue.  Difficulty concentrating.   Irritability.  Muscle tension.  Difficulty sleeping or unsatisfying sleep. DIAGNOSIS GAD is diagnosed through an assessment by your health care provider. Your health care provider will ask you questions aboutyour mood,physical symptoms, and events in your life. Your health care provider may ask you about your medical history and use of alcohol or drugs, including prescription medicines. Your health care provider may also do a physical exam and blood tests. Certain medical conditions and the use of certain substances can cause symptoms similar to those associated with GAD. Your health care provider may refer you to a mental health specialist for further evaluation. TREATMENT The following therapies are usually used to treat GAD:    Medication. Antidepressant medication usually is prescribed for long-term daily control. Antianxiety medicines may be added in severe cases, especially when panic attacks occur.   Talk therapy (psychotherapy). Certain types of talk therapy can be helpful in treating GAD by providing support, education, and guidance. A form of talk therapy called cognitive behavioral therapy can teach you healthy ways to think about and react to daily life events and activities.  Stress managementtechniques. These include yoga, meditation, and exercise and can be very helpful when they are practiced regularly. A mental health specialist can help determine which treatment is best for you. Some people see improvement with one therapy. However, other people require a combination of therapies.   This information is not intended to replace advice given to you by your health care provider. Make sure you discuss any questions you have with your health care provider.   Document Released: 04/27/2012 Document Revised: 01/21/2014 Document Reviewed: 04/27/2012 Elsevier Interactive Patient Education 2016 [employer].  

## 2015-02-24 LAB — CBC WITH DIFFERENTIAL/PLATELET
Basophils Absolute: 0 10*3/uL (ref 0.0–0.1)
Basophils Relative: 1 % (ref 0–1)
EOS PCT: 2 % (ref 0–5)
Eosinophils Absolute: 0.1 10*3/uL (ref 0.0–0.7)
HEMATOCRIT: 39.4 % (ref 36.0–46.0)
HEMOGLOBIN: 13.3 g/dL (ref 12.0–15.0)
LYMPHS ABS: 2.5 10*3/uL (ref 0.7–4.0)
LYMPHS PCT: 52 % — AB (ref 12–46)
MCH: 29.1 pg (ref 26.0–34.0)
MCHC: 33.8 g/dL (ref 30.0–36.0)
MCV: 86.2 fL (ref 78.0–100.0)
MONO ABS: 0.3 10*3/uL (ref 0.1–1.0)
MPV: 9.1 fL (ref 8.6–12.4)
Monocytes Relative: 7 % (ref 3–12)
NEUTROS ABS: 1.8 10*3/uL (ref 1.7–7.7)
Neutrophils Relative %: 38 % — ABNORMAL LOW (ref 43–77)
Platelets: 322 10*3/uL (ref 150–400)
RBC: 4.57 MIL/uL (ref 3.87–5.11)
RDW: 14.4 % (ref 11.5–15.5)
WBC: 4.8 10*3/uL (ref 4.0–10.5)

## 2015-02-24 LAB — VITAMIN D 25 HYDROXY (VIT D DEFICIENCY, FRACTURES): VIT D 25 HYDROXY: 27 ng/mL — AB (ref 30–100)

## 2015-02-24 LAB — T4, FREE: Free T4: 1 ng/dL (ref 0.8–1.8)

## 2015-02-24 LAB — VITAMIN B12: VITAMIN B 12: 565 pg/mL (ref 200–1100)

## 2015-02-24 LAB — TSH: TSH: 2.64 mIU/L

## 2015-02-24 MED FILL — DULoxetine HCL 20 MG CPEP: 20 | 30 days supply | Qty: 30 | Fill #0

## 2015-03-06 ENCOUNTER — Encounter: Payer: Self-pay | Admitting: Family Medicine

## 2015-04-04 MED FILL — DULoxetine HCL 20 MG CPEP: 20 | 30 days supply | Qty: 30 | Fill #1

## 2015-05-05 MED FILL — DULoxetine HCL 20 MG CPEP: 20 | 30 days supply | Qty: 30 | Fill #2

## 2015-05-31 DIAGNOSIS — D225 Melanocytic nevi of trunk: Secondary | ICD-10-CM | POA: Diagnosis not present

## 2015-05-31 DIAGNOSIS — D485 Neoplasm of uncertain behavior of skin: Secondary | ICD-10-CM | POA: Diagnosis not present

## 2015-05-31 DIAGNOSIS — L720 Epidermal cyst: Secondary | ICD-10-CM | POA: Diagnosis not present

## 2015-05-31 DIAGNOSIS — D2270 Melanocytic nevi of unspecified lower limb, including hip: Secondary | ICD-10-CM | POA: Diagnosis not present

## 2015-05-31 DIAGNOSIS — Z8719 Personal history of other diseases of the digestive system: Secondary | ICD-10-CM | POA: Diagnosis not present

## 2015-05-31 DIAGNOSIS — D216 Benign neoplasm of connective and other soft tissue of trunk, unspecified: Secondary | ICD-10-CM | POA: Diagnosis not present

## 2015-05-31 DIAGNOSIS — D171 Benign lipomatous neoplasm of skin and subcutaneous tissue of trunk: Secondary | ICD-10-CM | POA: Diagnosis not present

## 2015-05-31 MED FILL — FLUOCINONIDE 0.05% GEL: 0.05 | 14 days supply | Qty: 15 | Fill #0

## 2015-06-08 ENCOUNTER — Other Ambulatory Visit: Payer: Self-pay | Admitting: Family Medicine

## 2015-06-13 MED FILL — DULoxetine HCL 20 MG CPEP: 20 | 30 days supply | Qty: 30 | Fill #0

## 2015-07-11 ENCOUNTER — Other Ambulatory Visit: Payer: Self-pay | Admitting: Family Medicine

## 2015-07-11 NOTE — Telephone Encounter (Signed)
Pt req. Refill for    Original Order:  DULoxetine (CYMBALTA) 20 MG capsule [id number]        Pharmacy:  [place], [place] - [address].         [phone]

## 2015-07-12 ENCOUNTER — Telehealth: Payer: Self-pay

## 2015-07-12 NOTE — Telephone Encounter (Signed)
What is patient f/u plan?  She is overdue for office visit for prescription refills

## 2015-07-12 NOTE — Telephone Encounter (Signed)
fax request for Cymbalta - Note: no refills until seen in clinic Form faxed back to [facility] with above info.

## 2015-07-13 ENCOUNTER — Ambulatory Visit (INDEPENDENT_AMBULATORY_CARE_PROVIDER_SITE_OTHER): Payer: 59 | Admitting: Family Medicine

## 2015-07-13 VITALS — BP 110/74 | HR 88 | Temp 98.1°F | Resp 16 | Ht 64.0 in | Wt 146.0 lb

## 2015-07-13 DIAGNOSIS — E559 Vitamin D deficiency, unspecified: Secondary | ICD-10-CM

## 2015-07-13 DIAGNOSIS — F419 Anxiety disorder, unspecified: Principal | ICD-10-CM

## 2015-07-13 DIAGNOSIS — F418 Other specified anxiety disorders: Secondary | ICD-10-CM

## 2015-07-13 DIAGNOSIS — F329 Major depressive disorder, single episode, unspecified: Secondary | ICD-10-CM

## 2015-07-13 MED ORDER — ALPRAZOLAM 0.5 MG PO TABS: 0.5000 mg | ORAL_TABLET | Freq: Two times a day (BID) | ORAL | Status: DC | PRN

## 2015-07-13 MED ORDER — DULOXETINE HCL 30 MG PO CPEP: 30.0000 mg | ORAL_CAPSULE | Freq: Every day | ORAL | Status: DC

## 2015-07-13 MED FILL — DULoxetine HCL 30 MG CPEP: 30 | 30 days supply | Qty: 30 | Fill #0

## 2015-07-13 NOTE — Patient Instructions (Addendum)
IF you received an x-ray today, you will receive an invoice from [facility]. Please contact [facility] at [phone] with questions or concerns regarding your invoice.   IF you received labwork today, you will receive an invoice from [employer]. Please contact [facility] at [phone] with questions or concerns regarding your invoice.   Our billing staff will not be able to assist you with questions regarding bills from these companies.  You will be contacted with the lab results as soon as they are available. The fastest way to get your results is to activate your My Chart account. Instructions are located on the last page of this paperwork. If you have not heard from [place] regarding the results in 2 weeks, please contact this office.     Major Depressive Disorder Major depressive disorder is a mental illness. It also may be called clinical depression or unipolar depression. Major depressive disorder usually causes feelings of sadness, hopelessness, or helplessness. Some people with this disorder do not feel particularly sad but lose interest in doing things they used to enjoy (anhedonia). Major depressive disorder also can cause physical symptoms. It can interfere with work, school, relationships, and other normal everyday activities. The disorder varies in severity but is longer lasting and more serious than the sadness we all feel from time to time in our lives. Major depressive disorder often is triggered by stressful life events or major life changes. Examples of these triggers include divorce, loss of your job or home, a move, and the death of a family member or close friend. Sometimes this disorder occurs for no obvious reason at all. People who have family members with major depressive disorder or bipolar disorder are at higher risk for developing this disorder, with or without life stressors. Major depressive disorder can occur at any age.  It may occur just once in your life (single episode major depressive disorder). It may occur multiple times (recurrent major depressive disorder). SYMPTOMS People with major depressive disorder have either anhedonia or depressed mood on nearly a daily basis for at least 2 weeks or longer. Symptoms of depressed mood include:  Feelings of sadness (blue or down in the dumps) or emptiness.  Feelings of hopelessness or helplessness.  Tearfulness or episodes of crying (may be observed by others).  Irritability (children and adolescents). In addition to depressed mood or anhedonia or both, people with this disorder have at least four of the following symptoms:  Difficulty sleeping or sleeping too much.   Significant change (increase or decrease) in appetite or weight.   Lack of energy or motivation.  Feelings of guilt and worthlessness.   Difficulty concentrating, remembering, or making decisions.  Unusually slow movement (psychomotor retardation) or restlessness (as observed by others).   Recurrent wishes for death, recurrent thoughts of self-harm (suicide), or a suicide attempt. People with major depressive disorder commonly have persistent negative thoughts about themselves, other people, and the world. People with severe major depressive disorder may experiencedistorted beliefs or perceptions about the world (psychotic delusions). They also may see or hear things that are not real (psychotic hallucinations). DIAGNOSIS Major depressive disorder is diagnosed through an assessment by your health care provider. Your health care provider will ask aboutaspects of your daily life, such as mood,sleep, and appetite, to see if you have the diagnostic symptoms of major depressive disorder. Your health care provider may ask about your medical history and use of alcohol or drugs, including prescription medicines. Your health care provider  also may do a physical exam and blood work. This is because  certain medical conditions and the use of certain substances can cause major depressive disorder-like symptoms (secondary depression). Your health care provider also may refer you to a mental health specialist for further evaluation and treatment. TREATMENT It is important to recognize the symptoms of major depressive disorder and seek treatment. The following treatments can be prescribed for this disorder:   Medicine. Antidepressant medicines usually are prescribed. Antidepressant medicines are thought to correct chemical imbalances in the brain that are commonly associated with major depressive disorder. Other types of medicine may be added if the symptoms do not respond to antidepressant medicines alone or if psychotic delusions or hallucinations occur.  Talk therapy. Talk therapy can be helpful in treating major depressive disorder by providing support, education, and guidance. Certain types of talk therapy also can help with negative thinking (cognitive behavioral therapy) and with relationship issues that trigger this disorder (interpersonal therapy). A mental health specialist can help determine which treatment is best for you. Most people with major depressive disorder do well with a combination of medicine and talk therapy. Treatments involving electrical stimulation of the brain can be used in situations with extremely severe symptoms or when medicine and talk therapy do not work over time. These treatments include electroconvulsive therapy, transcranial magnetic stimulation, and vagal nerve stimulation.   This information is not intended to replace advice given to you by your health care provider. Make sure you discuss any questions you have with your health care provider.   Document Released: 04/27/2012 Document Revised: 01/21/2014 Document Reviewed: 04/27/2012 Elsevier Interactive Patient Education [employer].

## 2015-07-13 NOTE — Telephone Encounter (Signed)
Agree. Patient was to follow-up six weeks after visit in 02/2015.  Overdue for follow-up.

## 2015-07-13 NOTE — Progress Notes (Signed)
Subjective:    Patient ID: Lacey [name], female    DOB: 03/16/1955, 60 y.o.   MRN: [id number]  07/13/2015  Medication Refill   HPI This 60 y.o. female presents for four month follow-up for anxiety and vitamin D deficiency.  Seen on 02/23/15; diagnosed with anxiety; rx for Cymbalta 58m daily and Xanax.  Feeling much better.  Less hopeless; less negative.  Now enjoying things in life. Has started participating in [place] days per week.  Less irritable.   Really thinks that three surgeries in one year was the trigger to anxiety and depression.  Husband always on the go and busy.   Vitamin D deficiency: never started supplement.  Review of Systems  Constitutional: Negative for fever, chills, diaphoresis and fatigue.  Eyes: Negative for visual disturbance.  Respiratory: Negative for cough and shortness of breath.   Cardiovascular: Negative for chest pain, palpitations and leg swelling.  Gastrointestinal: Negative for nausea, vomiting, abdominal pain, diarrhea and constipation.  Endocrine: Negative for cold intolerance, heat intolerance, polydipsia, polyphagia and polyuria.  Neurological: Negative for dizziness, tremors, seizures, syncope, facial asymmetry, speech difficulty, weakness, light-headedness, numbness and headaches.  Psychiatric/Behavioral: Negative for suicidal ideas, sleep disturbance, self-injury and dysphoric mood. The patient is not nervous/anxious.     Past Medical History  Diagnosis Date  . GERD (gastroesophageal reflux disease)   . Anxiety   . Arthritis   . Rotator cuff tear    Past Surgical History  Procedure Laterality Date  . Orif ankle fracture  2009    left  . Hardware removal  2010    left ankle  . Upper gastrointestinal endoscopy    . Ercp  2012  . Total abdominal hysterectomy  2003    rt ovary,  . Anterior and posterior repair  2003    with the TAH  . Knee arthroscopy      right and left  . Tonsillectomy    . Shoulder open rotator cuff  repair  11/05/2011    Procedure: ROTATOR CUFF REPAIR SHOULDER OPEN;  Surgeon: PHessie Dibble MD;  [place] [facility]  Service: Orthopedics;  Laterality: Right;  RIGHT SHOULDER OPEN ROTATOR CUFF REPAIR  . Bicept tenodesis  11/05/2011    Procedure: BICEPS TENODESIS;  Surgeon: PHessie Dibble MD;  Location[place] [facility]  Service: Orthopedics;  Laterality: Right;   TENODESIS  AND  ACRMIOCLAVICULAR RESECTION   . Total knee arthroplasty Right 11/09/2013    Procedure: TOTAL KNEE ARTHROPLASTY;  Surgeon: PHessie Dibble MD;  [place] [facility]  Service: Orthopedics;  Laterality: Right;  . Joint replacement Right 2015  . Shoulder arthroscopy with rotator cuff repair and open biceps tenodesis Left 11/18/2014    Procedure: SHOULDER ARTHROSCOPY WITH ROTATOR CUFF REPAIR AND OPEN BICEPS TENODESIS;  Surgeon: PMelrose Nakayama MD;  Location[place] [facility]  Service: Orthopedics;  Laterality: Left;   Allergies  Allergen Reactions  . Morphine And Related Other (See Comments)    "feels crazy"    Social History   Social History  . Marital Status: Married    Spouse Name: N/A  . Number of Children: N/A  . Years of Education: N/A   Occupational History  . Not on file.   Social History Main Topics  . Smoking status: Never Smoker   . Smokeless tobacco: Never Used  . Alcohol Use: No     Comment: wine daily  . Drug Use: No  . Sexual Activity: Yes  Birth Control/ Protection: Surgical   Other Topics Concern  . Not on file   Social History Narrative   Family History  Problem Relation Age of Onset  . Depression Mother   . Hypertension Mother   . Diabetes Father        Objective:    BP 110/74 mmHg  Pulse 88  Temp(Src) 98.1 F (36.7 C) (Oral)  Resp 16  Ht '5\' 4"'  (1.626 m)  Wt 146 lb (66.225 kg)  BMI 25.05 kg/m2  SpO2 97% Physical Exam  Constitutional: She is oriented to person, place, and time. She appears well-developed and well-nourished. No  distress.  HENT:  Head: Normocephalic and atraumatic.  Right Ear: External ear normal.  Left Ear: External ear normal.  Nose: Nose normal.  Mouth/Throat: Oropharynx is clear and moist.  Eyes: Conjunctivae and EOM are normal. Pupils are equal, round, and reactive to light.  Neck: Normal range of motion. Neck supple. Carotid bruit is not present. No thyromegaly present.  Cardiovascular: Normal rate, regular rhythm, normal heart sounds and intact distal pulses.  Exam reveals no gallop and no friction rub.   No murmur heard. Pulmonary/Chest: Effort normal and breath sounds normal. She has no wheezes. She has no rales.  Abdominal: Soft. Bowel sounds are normal.  Lymphadenopathy:    She has no cervical adenopathy.  Neurological: She is alert and oriented to person, place, and time. No cranial nerve deficit.  Skin: Skin is warm and dry. No rash noted. She is not diaphoretic. No erythema. No pallor.  Psychiatric: She has a normal mood and affect. Her behavior is normal.   Results for orders placed or performed in visit on 02/23/15  CBC with Differential/Platelet  Result Value Ref Range   WBC 4.8 4.0 - 10.5 K/uL   RBC 4.57 3.87 - 5.11 MIL/uL   Hemoglobin 13.3 12.0 - 15.0 g/dL   HCT 39.4 36.0 - 46.0 %   MCV 86.2 78.0 - 100.0 fL   MCH 29.1 26.0 - 34.0 pg   MCHC 33.8 30.0 - 36.0 g/dL   RDW 14.4 11.5 - 15.5 %   Platelets 322 150 - 400 K/uL   MPV 9.1 8.6 - 12.4 fL   Neutrophils Relative % 38 (L) 43 - 77 %   Neutro Abs 1.8 1.7 - 7.7 K/uL   Lymphocytes Relative 52 (H) 12 - 46 %   Lymphs Abs 2.5 0.7 - 4.0 K/uL   Monocytes Relative 7 3 - 12 %   Monocytes Absolute 0.3 0.1 - 1.0 K/uL   Eosinophils Relative 2 0 - 5 %   Eosinophils Absolute 0.1 0.0 - 0.7 K/uL   Basophils Relative 1 0 - 1 %   Basophils Absolute 0.0 0.0 - 0.1 K/uL   Smear Review Criteria for review not met   TSH  Result Value Ref Range   TSH 2.64 mIU/L  Vitamin B12  Result Value Ref Range   Vitamin B-12 565 200 - 1100 pg/mL    VITAMIN D 25 Hydroxy (Vit-D Deficiency, Fractures)  Result Value Ref Range   Vit D, 25-Hydroxy 27 (L) 30 - 100 ng/mL  T4, free  Result Value Ref Range   Free T4 1.0 0.8 - 1.8 ng/dL       Assessment & Plan:   1. Anxiety and depression   2. Vitamin D deficiency    -improved with Cymbalta; will increase to 35m daily. -refill of Xanax provided; should use sparingly. -recommend vitamin D supplement daily   No orders  of the defined types were placed in this encounter.   Meds ordered this encounter  Medications  . DULoxetine (CYMBALTA) 30 MG capsule    Sig: Take 1 capsule (30 mg total) by mouth daily.    Dispense:  30 capsule    Refill:  5  . ALPRAZolam (XANAX) 0.5 MG tablet    Sig: Take 1 tablet (0.5 mg total) by mouth 2 (two) times daily as needed for anxiety or sleep.    Dispense:  30 tablet    Refill:  2    Return in about 6 months (around 01/12/2016) for recheck anxiety.    Talley Casco Elayne Guerin, M.D. [facility] [address] [place],   [address] [phone] phone [phone] fax

## 2015-07-28 ENCOUNTER — Telehealth: Payer: Self-pay

## 2015-07-28 NOTE — Telephone Encounter (Signed)
Pt states she has recently had her DULoxetine (CYMBALTA) switched from 20mg  to  30 MG capsule [id number]. She would like it switched back to 20mg . She is feeling like she has no energy, tired and sleepy. Pharmacy:  [place], [place] - [address] [facility].. Please advise at [phone]

## 2015-07-29 DIAGNOSIS — M1712 Unilateral primary osteoarthritis, left knee: Secondary | ICD-10-CM | POA: Diagnosis not present

## 2015-08-04 MED ORDER — DULOXETINE HCL 20 MG PO CPEP: 20.0000 mg | ORAL_CAPSULE | Freq: Every day | ORAL | Status: DC

## 2015-08-04 MED FILL — DULoxetine HCL 20 MG CPEP: 20 | 90 days supply | Qty: 90 | Fill #0 | Status: TO

## 2015-08-04 MED FILL — RESTASIS 0.05% EYE EMULSION: 0.05 | 30 days supply | Qty: 60 | Fill #1 | Status: TO

## 2015-08-04 NOTE — Telephone Encounter (Signed)
Call --- I sent in an rx for Cymbalta 20mg  daily to pharmacy; however, her current symptoms may improve/resolve after two weeks on the current dose of 30mg . She may want to continue 30mg  Cymbalta for one whole month before making the switch back to 20mg  tablet.

## 2015-08-04 NOTE — Telephone Encounter (Signed)
Called pt and advised message from provider on their voicemail.  

## 2015-08-07 DIAGNOSIS — M1712 Unilateral primary osteoarthritis, left knee: Secondary | ICD-10-CM | POA: Diagnosis not present

## 2015-08-14 DIAGNOSIS — M1712 Unilateral primary osteoarthritis, left knee: Secondary | ICD-10-CM | POA: Diagnosis not present

## 2015-08-15 DIAGNOSIS — H52223 Regular astigmatism, bilateral: Secondary | ICD-10-CM | POA: Diagnosis not present

## 2015-08-29 MED FILL — CELECOXIB 200 MG CAPSULE: 200 | 30 days supply | Qty: 30 | Fill #0

## 2015-10-19 MED FILL — CELECOXIB 200 MG CAPSULE: 200 | 30 days supply | Qty: 30 | Fill #1

## 2015-10-19 MED FILL — HYDROCODON-APAP 5-325: 5-325 | 30 days supply | Qty: 30 | Fill #0

## 2015-11-29 DIAGNOSIS — M1712 Unilateral primary osteoarthritis, left knee: Secondary | ICD-10-CM | POA: Diagnosis not present

## 2015-12-04 ENCOUNTER — Other Ambulatory Visit: Payer: Self-pay | Admitting: Orthopaedic Surgery

## 2015-12-25 ENCOUNTER — Encounter (HOSPITAL_COMMUNITY): Payer: Self-pay

## 2015-12-25 ENCOUNTER — Encounter (HOSPITAL_COMMUNITY)
Admission: RE | Admit: 2015-12-25 | Discharge: 2015-12-25 | Disposition: A | Discharge status: Home / Self Care | Payer: 59 | Source: Ambulatory Visit | Attending: Orthopaedic Surgery | Admitting: Orthopaedic Surgery

## 2015-12-25 ENCOUNTER — Ambulatory Visit (HOSPITAL_COMMUNITY)
Admission: RE | Admit: 2015-12-25 | Discharge: 2015-12-25 | Disposition: A | Discharge status: Home / Self Care | Payer: 59 | Source: Ambulatory Visit | Attending: Orthopaedic Surgery | Admitting: Orthopaedic Surgery

## 2015-12-25 DIAGNOSIS — Z01810 Encounter for preprocedural cardiovascular examination: Secondary | ICD-10-CM | POA: Insufficient documentation

## 2015-12-25 DIAGNOSIS — Z01818 Encounter for other preprocedural examination: Secondary | ICD-10-CM | POA: Diagnosis not present

## 2015-12-25 DIAGNOSIS — Z01812 Encounter for preprocedural laboratory examination: Secondary | ICD-10-CM | POA: Diagnosis not present

## 2015-12-25 LAB — CBC WITH DIFFERENTIAL/PLATELET
BASOS ABS: 0 10*3/uL (ref 0.0–0.1)
BASOS PCT: 1 %
EOS ABS: 0.2 10*3/uL (ref 0.0–0.7)
Eosinophils Relative: 4 %
HEMATOCRIT: 39.7 % (ref 36.0–46.0)
HEMOGLOBIN: 12.8 g/dL (ref 12.0–15.0)
Lymphocytes Relative: 36 %
Lymphs Abs: 1.9 10*3/uL (ref 0.7–4.0)
MCH: 29 pg (ref 26.0–34.0)
MCHC: 32.2 g/dL (ref 30.0–36.0)
MCV: 90 fL (ref 78.0–100.0)
MONOS PCT: 8 %
Monocytes Absolute: 0.4 10*3/uL (ref 0.1–1.0)
NEUTROS ABS: 2.7 10*3/uL (ref 1.7–7.7)
NEUTROS PCT: 51 %
Platelets: 318 10*3/uL (ref 150–400)
RBC: 4.41 MIL/uL (ref 3.87–5.11)
RDW: 13.1 % (ref 11.5–15.5)
WBC: 5.2 10*3/uL (ref 4.0–10.5)

## 2015-12-25 LAB — SURGICAL PCR SCREEN
MRSA, PCR: NEGATIVE
Staphylococcus aureus: POSITIVE — AB

## 2015-12-25 LAB — APTT: aPTT: 27 seconds (ref 24–36)

## 2015-12-25 LAB — BASIC METABOLIC PANEL
ANION GAP: 8 (ref 5–15)
BUN: 17 mg/dL (ref 6–20)
CALCIUM: 9.5 mg/dL (ref 8.9–10.3)
CO2: 25 mmol/L (ref 22–32)
CREATININE: 0.89 mg/dL (ref 0.44–1.00)
Chloride: 106 mmol/L (ref 101–111)
GFR calc non Af Amer: >60 mL/min (ref >60)
Glucose, Bld: 85 mg/dL (ref 65–99)
Potassium: 4.1 mmol/L (ref 3.5–5.1)
SODIUM: 139 mmol/L (ref 135–145)

## 2015-12-25 LAB — URINALYSIS, ROUTINE W REFLEX MICROSCOPIC
BILIRUBIN URINE: NEGATIVE
GLUCOSE, UA: NEGATIVE mg/dL
HGB URINE DIPSTICK: NEGATIVE
KETONES UR: NEGATIVE mg/dL
Nitrite: NEGATIVE
PH: 5.5 (ref 5.0–8.0)
PROTEIN: NEGATIVE mg/dL
Specific Gravity, Urine: <1.005 — ABNORMAL LOW (ref 1.005–1.030)

## 2015-12-25 LAB — URINALYSIS, MICROSCOPIC (REFLEX)
Bacteria, UA: NONE SEEN
RBC / HPF: NONE SEEN RBC/hpf (ref 0–5)
WBC, UA: NONE SEEN WBC/hpf (ref 0–5)

## 2015-12-25 LAB — PROTIME-INR
INR: 0.9
PROTHROMBIN TIME: 12.2 s (ref 11.4–15.2)

## 2015-12-25 LAB — TYPE AND SCREEN
ABO/RH(D): A POS
ANTIBODY SCREEN: NEGATIVE

## 2015-12-25 NOTE — Pre-Procedure Instructions (Addendum)
Lacey [name]  12/25/2015      [place], [place] - [address] [facility]. [address] [place] [address] Phone: [phone] Fax: [phone]    Your procedure is scheduled on Tuesday, December 19th   Report to [facility] Admitting at 8:15 AM             (posted surgery time 1-:18 am - 12:27 pm)   Call this number if you have problems the MORNING of surgery:  [phone]   Remember:  Do not eat food or drink liquids after midnight Monday.   Take these medicines the morning of surgery with A SIP OF WATER : Hydrocodone                           4-5 days prior to surgery, STOP taking any vitamins, herbal supplements, anti-inflammatories   Do not wear jewelry, make-up or nail polish.  Do not wear lotions, powders, or perfumes, or deoderant.   Do not shave underarms & legs 48 hours prior to surgery.  Do not bring valuables to the hospital.  [facility] is not responsible for any belongings or valuables.  Contacts, dentures or bridgework may not be worn into surgery.  Leave your suitcase in the car.  After surgery it may be brought to your room.  For patients admitted to the hospital, discharge time will be determined by your treatment team.  Please read over the following fact sheets that you were given. Pain Booklet, MRSA Information and Surgical Site Infection Prevention

## 2015-12-25 NOTE — Progress Notes (Signed)
Mupirocin Ointment Rx called into [facility] for PCR of Staph. Left message on pt's voicemail instructing her to pick up Rx and start using it.

## 2015-12-26 MED FILL — MUPIROCIN 2% OINTMENT: 2 | 5 days supply | Qty: 22 | Fill #0

## 2015-12-27 NOTE — H&P (Signed)
TOTAL KNEE ADMISSION H&P  Patient is being admitted for left total knee arthroplasty.  Subjective:  Chief Complaint:left knee pain.  HPI: Lacey [name], 60 y.o. female, has a history of pain and functional disability in the left knee due to arthritis and has failed non-surgical conservative treatments for greater than 12 weeks to includeNSAID's and/or analgesics, corticosteriod injections, viscosupplementation injections, flexibility and strengthening excercises, supervised PT with diminished ADL's post treatment, use of assistive devices, weight reduction as appropriate and activity modification.  Onset of symptoms was gradual, starting 5 years ago with gradually worsening course since that time. The patient noted no past surgery on the left knee(s).  Patient currently rates pain in the left knee(s) at 10 out of 10 with activity. Patient has night pain, worsening of pain with activity and weight bearing, pain that interferes with activities of daily living, crepitus and joint swelling.  Patient has evidence of subchondral cysts, subchondral sclerosis, periarticular osteophytes and joint space narrowing by imaging studies. There is no active infection.  Patient Active Problem List   Diagnosis Date Noted  . 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 Anxiety   . Arthritis   . GERD (gastroesophageal reflux disease)   . Rotator cuff tear     Past Surgical History:  Procedure Laterality Date  . ANTERIOR AND POSTERIOR REPAIR  2003   with the TAH  . BICEPT TENODESIS  11/05/2011   Procedure: BICEPS TENODESIS;  Surgeon: Hessie Dibble, MD;  Location: [facility];  Service: Orthopedics;  Laterality: Right;   TENODESIS  AND  ACRMIOCLAVICULAR RESECTION   . ERCP  2012  . HARDWARE REMOVAL  2010   left ankle  . JOINT REPLACEMENT Right 2015  . KNEE ARTHROSCOPY     right and left  . ORIF ANKLE FRACTURE  2009   left  . SHOULDER ARTHROSCOPY WITH ROTATOR  CUFF REPAIR AND OPEN BICEPS TENODESIS Left 11/18/2014   Procedure: SHOULDER ARTHROSCOPY WITH ROTATOR CUFF REPAIR AND OPEN BICEPS TENODESIS;  Surgeon: Melrose Nakayama, MD;  Location: [facility];  Service: Orthopedics;  Laterality: Left;  . SHOULDER OPEN ROTATOR CUFF REPAIR  11/05/2011   Procedure: ROTATOR CUFF REPAIR SHOULDER OPEN;  Surgeon: Hessie Dibble, MD;  Location: [facility];  Service: Orthopedics;  Laterality: Right;  RIGHT SHOULDER OPEN ROTATOR CUFF REPAIR  . TONSILLECTOMY    . TOTAL ABDOMINAL HYSTERECTOMY  2003   rt ovary,  . TOTAL KNEE ARTHROPLASTY Right 11/09/2013   Procedure: TOTAL KNEE ARTHROPLASTY;  Surgeon: Hessie Dibble, MD;  Location: [facility];  Service: Orthopedics;  Laterality: Right;  . UPPER GASTROINTESTINAL ENDOSCOPY      No prescriptions prior to admission.   Allergies  Allergen Reactions  . Morphine And Related Other (See Comments)    "feels crazy"    Social History  Substance Use Topics  . Smoking status: Never Smoker  . Smokeless tobacco: Never Used  . Alcohol use No     Comment: wine daily    Family History  Problem Relation Age of Onset  . Depression Mother   . Hypertension Mother   . Diabetes Father      Review of Systems  Musculoskeletal: Positive for joint pain.       Left knee    Objective:  Physical Exam  Constitutional: She is oriented to person, place, and time. She appears well-developed and well-nourished.  HENT:  Head: Normocephalic and atraumatic.  Eyes:  Pupils are equal, round, and reactive to light.  Neck: Normal range of motion.  Cardiovascular: Normal rate and regular rhythm.   Respiratory: Effort normal.  GI: Soft.  Musculoskeletal:  Examination of the left knee shows range of motion from 0-120.  Mild crepitation.  No significant effusion.  Her ligaments are stable.  Her calf is soft and nontender.  She is neurovascularly intact distally.    Neurological: She is alert and oriented to person,  place, and time.  Skin: Skin is warm and dry.  Psychiatric: She has a normal mood and affect. Her behavior is normal. Judgment and thought content normal.    Vital signs in last 24 hours:    Labs:   Estimated body mass index is 25.28 kg/m as calculated from the following:   Height as of 12/25/15: 5\' 5"  (1.651 m).   Weight as of 12/25/15: 68.9 kg (151 lb 14.4 oz).   Imaging Review Plain radiographs demonstrate severe degenerative joint disease of the left knee(s). The overall alignment isneutral. The bone quality appears to be good for age and reported activity level.  Assessment/Plan:  End stage primary arthritis, left knee   The patient history, physical examination, clinical judgment of the provider and imaging studies are consistent with end stage degenerative joint disease of the left knee(s) and total knee arthroplasty is deemed medically necessary. The treatment options including medical management, injection therapy arthroscopy and arthroplasty were discussed at length. The risks and benefits of total knee arthroplasty were presented and reviewed. The risks due to aseptic loosening, infection, stiffness, patella tracking problems, thromboembolic complications and other imponderables were discussed. The patient acknowledged the explanation, agreed to proceed with the plan and consent was signed. Patient is being admitted for inpatient treatment for surgery, pain control, PT, OT, prophylactic antibiotics, VTE prophylaxis, progressive ambulation and ADL's and discharge planning. The patient is planning to be discharged home with home health services

## 2015-12-28 MED FILL — HYDROCODON-APAP 5-325: 5-325 | 30 days supply | Qty: 30 | Fill #0

## 2016-01-02 ENCOUNTER — Inpatient Hospital Stay (HOSPITAL_COMMUNITY)
Admission: RE | Admit: 2016-01-02 | Discharge: 2016-01-04 | DRG: 470 | Disposition: A | Discharge status: Home Health | Payer: 59 | Source: Ambulatory Visit | Attending: Orthopaedic Surgery | Admitting: Orthopaedic Surgery

## 2016-01-02 ENCOUNTER — Inpatient Hospital Stay (HOSPITAL_COMMUNITY): Payer: 59 | Admitting: Anesthesiology

## 2016-01-02 ENCOUNTER — Encounter (HOSPITAL_COMMUNITY)
Admission: RE | Disposition: A | Discharge status: Home Health | Payer: Self-pay | Source: Ambulatory Visit | Attending: Orthopaedic Surgery

## 2016-01-02 ENCOUNTER — Encounter (HOSPITAL_COMMUNITY): Payer: Self-pay | Admitting: *Deleted

## 2016-01-02 DIAGNOSIS — K219 Gastro-esophageal reflux disease without esophagitis: Secondary | ICD-10-CM | POA: Diagnosis present

## 2016-01-02 DIAGNOSIS — Z885 Allergy status to narcotic agent status: Secondary | ICD-10-CM | POA: Diagnosis not present

## 2016-01-02 DIAGNOSIS — M1712 Unilateral primary osteoarthritis, left knee: Secondary | ICD-10-CM | POA: Diagnosis not present

## 2016-01-02 DIAGNOSIS — Z96652 Presence of left artificial knee joint: Secondary | ICD-10-CM | POA: Diagnosis not present

## 2016-01-02 DIAGNOSIS — G8918 Other acute postprocedural pain: Secondary | ICD-10-CM | POA: Diagnosis not present

## 2016-01-02 DIAGNOSIS — F419 Anxiety disorder, unspecified: Secondary | ICD-10-CM | POA: Diagnosis present

## 2016-01-02 DIAGNOSIS — Z96651 Presence of right artificial knee joint: Secondary | ICD-10-CM | POA: Diagnosis present

## 2016-01-02 HISTORY — PX: TOTAL KNEE ARTHROPLASTY: SHX125

## 2016-01-02 SURGERY — ARTHROPLASTY, KNEE, TOTAL
Anesthesia: Monitor Anesthesia Care | Site: Knee | Laterality: Left

## 2016-01-02 MED ORDER — ONDANSETRON HCL 4 MG/2ML IJ SOLN
INTRAMUSCULAR | Status: DC | PRN
  Administered 2016-01-02: 4 mg via INTRAVENOUS

## 2016-01-02 MED ORDER — ACETAMINOPHEN 650 MG RE SUPP: 650.0000 mg | Freq: Four times a day (QID) | RECTAL | Status: DC | PRN

## 2016-01-02 MED ORDER — ACETAMINOPHEN 325 MG PO TABS: 650.0000 mg | ORAL_TABLET | Freq: Four times a day (QID) | ORAL | Status: DC | PRN

## 2016-01-02 MED ORDER — DOCUSATE SODIUM 100 MG PO CAPS
100.0000 mg | ORAL_CAPSULE | Freq: Two times a day (BID) | ORAL | Status: DC
  Administered 2016-01-02 – 2016-01-04 (×4): 100 mg via ORAL
  Filled 2016-01-02 (×4): qty 1

## 2016-01-02 MED ORDER — OXYCODONE HCL 5 MG PO TABS
5.0000 mg | ORAL_TABLET | ORAL | Status: DC | PRN
  Administered 2016-01-02 – 2016-01-03 (×4): 10 mg via ORAL
  Filled 2016-01-02 (×4): qty 2

## 2016-01-02 MED ORDER — FENTANYL CITRATE (PF) 100 MCG/2ML IJ SOLN
INTRAMUSCULAR | Status: AC
  Filled 2016-01-02: qty 2

## 2016-01-02 MED ORDER — SODIUM CHLORIDE 0.9 % IR SOLN
Status: DC | PRN
  Administered 2016-01-02: 3000 mL

## 2016-01-02 MED ORDER — ONDANSETRON HCL 4 MG PO TABS: 4.0000 mg | ORAL_TABLET | Freq: Four times a day (QID) | ORAL | Status: DC | PRN

## 2016-01-02 MED ORDER — SODIUM CHLORIDE 0.9 % IV SOLN
2000.0000 mg | Freq: Once | INTRAVENOUS | Status: DC
  Filled 2016-01-02: qty 20

## 2016-01-02 MED ORDER — BUPIVACAINE HCL (PF) 0.25 % IJ SOLN
INTRAMUSCULAR | Status: DC | PRN
  Administered 2016-01-02: 20 mL

## 2016-01-02 MED ORDER — ONDANSETRON HCL 4 MG/2ML IJ SOLN
INTRAMUSCULAR | Status: AC
  Filled 2016-01-02: qty 2

## 2016-01-02 MED ORDER — PHENYLEPHRINE HCL 10 MG/ML IJ SOLN
INTRAVENOUS | Status: DC | PRN
  Administered 2016-01-02: 20 ug/min via INTRAVENOUS

## 2016-01-02 MED ORDER — BUPIVACAINE LIPOSOME 1.3 % IJ SUSP
INTRAMUSCULAR | Status: DC | PRN
  Administered 2016-01-02: 20 mL

## 2016-01-02 MED ORDER — TRANEXAMIC ACID 1000 MG/10ML IV SOLN
1000.0000 mg | INTRAVENOUS | Status: AC
  Administered 2016-01-02: 1000 mg via INTRAVENOUS
  Filled 2016-01-02: qty 10

## 2016-01-02 MED ORDER — ASPIRIN EC 325 MG PO TBEC
325.0000 mg | DELAYED_RELEASE_TABLET | Freq: Two times a day (BID) | ORAL | Status: DC
  Administered 2016-01-03 – 2016-01-04 (×3): 325 mg via ORAL
  Filled 2016-01-02 (×3): qty 1

## 2016-01-02 MED ORDER — LIDOCAINE HCL (CARDIAC) 20 MG/ML IV SOLN
INTRAVENOUS | Status: DC | PRN
  Administered 2016-01-02: 40 mg via INTRAVENOUS

## 2016-01-02 MED ORDER — ALPRAZOLAM 0.5 MG PO TABS
0.5000 mg | ORAL_TABLET | Freq: Three times a day (TID) | ORAL | Status: DC | PRN
  Administered 2016-01-02 – 2016-01-03 (×2): 0.5 mg via ORAL
  Filled 2016-01-02 (×2): qty 1

## 2016-01-02 MED ORDER — SODIUM CHLORIDE 0.9 % IJ SOLN
INTRAMUSCULAR | Status: DC | PRN
  Administered 2016-01-02: 40 mL

## 2016-01-02 MED ORDER — METHOCARBAMOL 1000 MG/10ML IJ SOLN
500.0000 mg | Freq: Four times a day (QID) | INTRAVENOUS | Status: DC | PRN
  Filled 2016-01-02: qty 5

## 2016-01-02 MED ORDER — FENTANYL CITRATE (PF) 100 MCG/2ML IJ SOLN
INTRAMUSCULAR | Status: AC
Start: 2016-01-02 — End: 2016-01-02
  Filled 2016-01-02: qty 2

## 2016-01-02 MED ORDER — ONDANSETRON HCL 4 MG/2ML IJ SOLN: 4.0000 mg | Freq: Once | INTRAMUSCULAR | Status: DC | PRN

## 2016-01-02 MED ORDER — FENTANYL CITRATE (PF) 100 MCG/2ML IJ SOLN
INTRAMUSCULAR | Status: AC
  Administered 2016-01-02: 100 ug
  Filled 2016-01-02: qty 2

## 2016-01-02 MED ORDER — BUPIVACAINE-EPINEPHRINE (PF) 0.25% -1:200000 IJ SOLN
INTRAMUSCULAR | Status: AC
  Filled 2016-01-02: qty 30

## 2016-01-02 MED ORDER — ONDANSETRON HCL 4 MG/2ML IJ SOLN
4.0000 mg | Freq: Four times a day (QID) | INTRAMUSCULAR | Status: DC | PRN
  Administered 2016-01-02: 4 mg via INTRAVENOUS
  Filled 2016-01-02 (×2): qty 2

## 2016-01-02 MED ORDER — CYCLOSPORINE 0.05 % OP EMUL
1.0000 [drp] | Freq: Two times a day (BID) | OPHTHALMIC | Status: DC
  Administered 2016-01-02 – 2016-01-04 (×2): 1 [drp] via OPHTHALMIC
  Filled 2016-01-02 (×4): qty 1

## 2016-01-02 MED ORDER — KETOROLAC TROMETHAMINE 30 MG/ML IJ SOLN
INTRAMUSCULAR | Status: AC
  Filled 2016-01-02: qty 1

## 2016-01-02 MED ORDER — BUPIVACAINE LIPOSOME 1.3 % IJ SUSP
20.0000 mL | INTRAMUSCULAR | Status: DC
  Filled 2016-01-02: qty 20

## 2016-01-02 MED ORDER — DULOXETINE HCL 20 MG PO CPEP
20.0000 mg | ORAL_CAPSULE | Freq: Every day | ORAL | Status: DC
  Administered 2016-01-02 – 2016-01-03 (×2): 20 mg via ORAL
  Filled 2016-01-02 (×2): qty 1

## 2016-01-02 MED ORDER — LACTATED RINGERS IV SOLN
INTRAVENOUS | Status: DC
  Administered 2016-01-02 (×3): via INTRAVENOUS

## 2016-01-02 MED ORDER — PROMETHAZINE HCL 25 MG PO TABS: 12.5000 mg | ORAL_TABLET | Freq: Four times a day (QID) | ORAL | Status: DC | PRN

## 2016-01-02 MED ORDER — MIDAZOLAM HCL 2 MG/2ML IJ SOLN: 2.0000 mg | Freq: Once | INTRAMUSCULAR | Status: DC

## 2016-01-02 MED ORDER — KETOROLAC TROMETHAMINE 30 MG/ML IJ SOLN
30.0000 mg | Freq: Once | INTRAMUSCULAR | Status: AC
  Administered 2016-01-02: 30 mg via INTRAVENOUS

## 2016-01-02 MED ORDER — TRANEXAMIC ACID 1000 MG/10ML IV SOLN
INTRAVENOUS | Status: DC | PRN
  Administered 2016-01-02: 2000 mg via TOPICAL

## 2016-01-02 MED ORDER — CEFAZOLIN SODIUM-DEXTROSE 2-4 GM/100ML-% IV SOLN
2.0000 g | INTRAVENOUS | Status: AC
  Administered 2016-01-02: 10 g via INTRAVENOUS

## 2016-01-02 MED ORDER — BISACODYL 5 MG PO TBEC: 5.0000 mg | DELAYED_RELEASE_TABLET | Freq: Every day | ORAL | Status: DC | PRN

## 2016-01-02 MED ORDER — HYDROMORPHONE HCL 2 MG/ML IJ SOLN
0.5000 mg | INTRAMUSCULAR | Status: DC | PRN
  Administered 2016-01-02: 1 mg via INTRAVENOUS
  Administered 2016-01-02: 0.5 mg via INTRAVENOUS
  Administered 2016-01-03: 1 mg via INTRAVENOUS
  Filled 2016-01-02 (×3): qty 1

## 2016-01-02 MED ORDER — TRANEXAMIC ACID 1000 MG/10ML IV SOLN
1000.0000 mg | Freq: Once | INTRAVENOUS | Status: AC
  Administered 2016-01-02: 1000 mg via INTRAVENOUS
  Filled 2016-01-02: qty 10

## 2016-01-02 MED ORDER — FENTANYL CITRATE (PF) 100 MCG/2ML IJ SOLN
25.0000 ug | INTRAMUSCULAR | Status: DC | PRN
  Administered 2016-01-02 (×3): 50 ug via INTRAVENOUS

## 2016-01-02 MED ORDER — DEXAMETHASONE SODIUM PHOSPHATE 10 MG/ML IJ SOLN
INTRAMUSCULAR | Status: AC
  Filled 2016-01-02: qty 1

## 2016-01-02 MED ORDER — CEFAZOLIN SODIUM-DEXTROSE 2-4 GM/100ML-% IV SOLN
2.0000 g | Freq: Four times a day (QID) | INTRAVENOUS | Status: AC
  Administered 2016-01-02 (×2): 2 g via INTRAVENOUS
  Filled 2016-01-02 (×2): qty 100

## 2016-01-02 MED ORDER — CEFAZOLIN SODIUM-DEXTROSE 2-4 GM/100ML-% IV SOLN
INTRAVENOUS | Status: AC
  Filled 2016-01-02: qty 100

## 2016-01-02 MED ORDER — MENTHOL 3 MG MT LOZG: 1.0000 | LOZENGE | OROMUCOSAL | Status: DC | PRN

## 2016-01-02 MED ORDER — METHOCARBAMOL 500 MG PO TABS
500.0000 mg | ORAL_TABLET | Freq: Four times a day (QID) | ORAL | Status: DC | PRN
  Administered 2016-01-02 – 2016-01-03 (×2): 500 mg via ORAL
  Filled 2016-01-02 (×2): qty 1

## 2016-01-02 MED ORDER — DIPHENHYDRAMINE HCL 25 MG PO TABS
50.0000 mg | ORAL_TABLET | Freq: Every day | ORAL | Status: DC
  Administered 2016-01-02 – 2016-01-03 (×2): 50 mg via ORAL
  Filled 2016-01-02 (×4): qty 2

## 2016-01-02 MED ORDER — 0.9 % SODIUM CHLORIDE (POUR BTL) OPTIME
TOPICAL | Status: DC | PRN
  Administered 2016-01-02: 1000 mL

## 2016-01-02 MED ORDER — LACTATED RINGERS IV SOLN
INTRAVENOUS | Status: DC
  Administered 2016-01-02 – 2016-01-03 (×2): via INTRAVENOUS

## 2016-01-02 MED ORDER — PROPOFOL 10 MG/ML IV BOLUS
INTRAVENOUS | Status: DC | PRN
  Administered 2016-01-02: 50 mg via INTRAVENOUS

## 2016-01-02 MED ORDER — DIPHENHYDRAMINE HCL 12.5 MG/5ML PO ELIX
12.5000 mg | ORAL_SOLUTION | ORAL | Status: DC | PRN
Start: 2016-01-02 — End: 2016-01-04
  Administered 2016-01-03: 25 mg via ORAL
  Filled 2016-01-02: qty 10

## 2016-01-02 MED ORDER — METOCLOPRAMIDE HCL 5 MG/ML IJ SOLN: 5.0000 mg | Freq: Three times a day (TID) | INTRAMUSCULAR | Status: DC | PRN

## 2016-01-02 MED ORDER — PHENOL 1.4 % MT LIQD: 1.0000 | OROMUCOSAL | Status: DC | PRN

## 2016-01-02 MED ORDER — CHLORHEXIDINE GLUCONATE 4 % EX LIQD: 60.0000 mL | Freq: Once | CUTANEOUS | Status: DC

## 2016-01-02 MED ORDER — METOCLOPRAMIDE HCL 5 MG PO TABS: 5.0000 mg | ORAL_TABLET | Freq: Three times a day (TID) | ORAL | Status: DC | PRN

## 2016-01-02 MED ORDER — ALUM & MAG HYDROXIDE-SIMETH 200-200-20 MG/5ML PO SUSP: 30.0000 mL | ORAL | Status: DC | PRN

## 2016-01-02 MED ORDER — MIDAZOLAM HCL 2 MG/2ML IJ SOLN
INTRAMUSCULAR | Status: AC
  Administered 2016-01-02: 2 mg
  Filled 2016-01-02: qty 2

## 2016-01-02 MED ORDER — PROPOFOL 500 MG/50ML IV EMUL
INTRAVENOUS | Status: DC | PRN
  Administered 2016-01-02: 75 ug/kg/min via INTRAVENOUS

## 2016-01-02 SURGICAL SUPPLY — 64 items
BAG DECANTER FOR FLEXI CONT (MISCELLANEOUS) IMPLANT
BANDAGE ACE 4X5 VEL STRL LF (GAUZE/BANDAGES/DRESSINGS) ×3 IMPLANT
BANDAGE ELASTIC 4 VELCRO ST LF (GAUZE/BANDAGES/DRESSINGS) ×2 IMPLANT
BANDAGE ELASTIC 6 VELCRO ST LF (GAUZE/BANDAGES/DRESSINGS) ×2 IMPLANT
BANDAGE ESMARK 6X9 LF (GAUZE/BANDAGES/DRESSINGS) ×1 IMPLANT
BLADE SAGITTAL 25.0X1.19X90 (BLADE) ×1 IMPLANT
BLADE SAGITTAL 25.0X1.19X90MM (BLADE) ×1
BLADE SAW SGTL 13.0X1.19X90.0M (BLADE) ×2 IMPLANT
BLADE SURG 10 STRL SS (BLADE) ×4 IMPLANT
BNDG CMPR 9X6 STRL LF SNTH (GAUZE/BANDAGES/DRESSINGS) ×1
BNDG CMPR MED 10X6 ELC LF (GAUZE/BANDAGES/DRESSINGS) ×1
BNDG ELASTIC 6X10 VLCR STRL LF (GAUZE/BANDAGES/DRESSINGS) ×3 IMPLANT
BNDG ESMARK 6X9 LF (GAUZE/BANDAGES/DRESSINGS) ×3
BNDG GAUZE ELAST 4 BULKY (GAUZE/BANDAGES/DRESSINGS) ×6 IMPLANT
BOWL SMART MIX CTS (DISPOSABLE) ×3 IMPLANT
CAPT KNEE TOTAL 3 ATTUNE ×2 IMPLANT
CEMENT HV SMART SET (Cement) ×6 IMPLANT
CLOSURE WOUND 1/2 X4 (GAUZE/BANDAGES/DRESSINGS) ×1
COVER SURGICAL LIGHT HANDLE (MISCELLANEOUS) ×3 IMPLANT
CUFF TOURNIQUET SINGLE 34IN LL (TOURNIQUET CUFF) ×3 IMPLANT
CUFF TOURNIQUET SINGLE 44IN (TOURNIQUET CUFF) IMPLANT
DECANTER SPIKE VIAL GLASS SM (MISCELLANEOUS) ×3 IMPLANT
DRAPE EXTREMITY T 121X128X90 (DRAPE) ×3 IMPLANT
DRAPE PROXIMA HALF (DRAPES) ×6 IMPLANT
DRAPE U-SHAPE 47X51 STRL (DRAPES) ×3 IMPLANT
DRSG ADAPTIC 3X8 NADH LF (GAUZE/BANDAGES/DRESSINGS) ×3 IMPLANT
DRSG PAD ABDOMINAL 8X10 ST (GAUZE/BANDAGES/DRESSINGS) ×5 IMPLANT
DURAPREP 26ML APPLICATOR (WOUND CARE) ×3 IMPLANT
ELECT REM PT RETURN 9FT ADLT (ELECTROSURGICAL) ×3
ELECTRODE REM PT RTRN 9FT ADLT (ELECTROSURGICAL) ×1 IMPLANT
GAUZE SPONGE 4X4 12PLY STRL (GAUZE/BANDAGES/DRESSINGS) ×3 IMPLANT
GLOVE BIO SURGEON STRL SZ8 (GLOVE) ×6 IMPLANT
GLOVE BIOGEL PI IND STRL 7.0 (GLOVE) IMPLANT
GLOVE BIOGEL PI IND STRL 8 (GLOVE) ×2 IMPLANT
GLOVE BIOGEL PI INDICATOR 7.0 (GLOVE) ×2
GLOVE BIOGEL PI INDICATOR 8 (GLOVE) ×4
GOWN STRL REUS W/ TWL LRG LVL3 (GOWN DISPOSABLE) ×1 IMPLANT
GOWN STRL REUS W/ TWL XL LVL3 (GOWN DISPOSABLE) ×2 IMPLANT
GOWN STRL REUS W/TWL LRG LVL3 (GOWN DISPOSABLE) ×3
GOWN STRL REUS W/TWL XL LVL3 (GOWN DISPOSABLE) ×6
HANDPIECE INTERPULSE COAX TIP (DISPOSABLE) ×3
HOOD PEEL AWAY FACE SHEILD DIS (HOOD) ×6 IMPLANT
IMMOBILIZER KNEE 22 UNIV (SOFTGOODS) ×3 IMPLANT
KIT BASIN OR (CUSTOM PROCEDURE TRAY) ×3 IMPLANT
KIT ROOM TURNOVER OR (KITS) ×3 IMPLANT
MANIFOLD NEPTUNE II (INSTRUMENTS) ×3 IMPLANT
NDL HYPO 21X1 ECLIPSE (NEEDLE) ×1 IMPLANT
NEEDLE HYPO 21X1 ECLIPSE (NEEDLE) ×3 IMPLANT
NS IRRIG 1000ML POUR BTL (IV SOLUTION) ×3 IMPLANT
PACK TOTAL JOINT (CUSTOM PROCEDURE TRAY) ×3 IMPLANT
PAD ARMBOARD 7.5X6 YLW CONV (MISCELLANEOUS) ×6 IMPLANT
SET HNDPC FAN SPRY TIP SCT (DISPOSABLE) ×1 IMPLANT
SPONGE GAUZE 4X4 12PLY STER LF (GAUZE/BANDAGES/DRESSINGS) ×2 IMPLANT
STRIP CLOSURE SKIN 1/2X4 (GAUZE/BANDAGES/DRESSINGS) ×2 IMPLANT
SUT MNCRL AB 3-0 PS2 18 (SUTURE) ×3 IMPLANT
SUT VIC AB 0 CT1 27 (SUTURE) ×6
SUT VIC AB 0 CT1 27XBRD ANBCTR (SUTURE) ×2 IMPLANT
SUT VIC AB 2-0 CT1 27 (SUTURE) ×9
SUT VIC AB 2-0 CT1 TAPERPNT 27 (SUTURE) ×2 IMPLANT
SUT VLOC 180 0 24IN GS25 (SUTURE) ×3 IMPLANT
SYR 50ML LL SCALE MARK (SYRINGE) ×3 IMPLANT
TOWEL OR 17X24 6PK STRL BLUE (TOWEL DISPOSABLE) ×3 IMPLANT
TOWEL OR 17X26 10 PK STRL BLUE (TOWEL DISPOSABLE) ×3 IMPLANT
TRAY CATH 16FR W/PLASTIC CATH (SET/KITS/TRAYS/PACK) IMPLANT

## 2016-01-02 NOTE — Anesthesia Procedure Notes (Addendum)
Anesthesia Regional Block:  Adductor canal block  Pre-Anesthetic Checklist: ,, timeout performed, Correct Patient, Correct Site, Correct Laterality, Correct Procedure, Correct Position, site marked, Risks and benefits discussed,  Surgical consent,  Pre-op evaluation,  At surgeon's request and post-op pain management  Laterality: Left  Prep: chloraprep       Needles:  Injection technique: Single-shot  Needle Type: Stimulator Needle - 80          Additional Needles:  Procedures: ultrasound guided (picture in chart) Adductor canal block Narrative:  Start time: 01/02/2016 9:50 AM End time: 01/02/2016 9:55 AM Injection made incrementally with aspirations every 5 mL.  Performed by: Personally   Additional Notes: 15 cc 0.75% Ropivacaine injected easily

## 2016-01-02 NOTE — Progress Notes (Signed)
Orthopedic Tech Progress Note Patient Details:  Asa Daiuto [facility] July 30, 1955 [id number]  CPM Left Knee CPM Left Knee: On Left Knee Flexion (Degrees): 90 Left Knee Extension (Degrees): 0 Additional Comments: Applied CPM at 0-90, Applied Overhead frame trapeze bar, provided bone foam (footsie roll) for Left knee/Leg.  Pt tolerated well.   Kristopher Oppenheim 01/02/2016, 3:11 PM

## 2016-01-02 NOTE — Anesthesia Procedure Notes (Signed)
Spinal  Start time: 01/02/2016 10:30 AM End time: 01/02/2016 10:34 AM Staffing Anesthesiologist: Linna Caprice, Jalisia Puchalski Performed: anesthesiologist  Preanesthetic Checklist Completed: patient identified, site marked, surgical consent, pre-op evaluation, timeout performed, IV checked, risks and benefits discussed and monitors and equipment checked Spinal Block Patient position: sitting Prep: Betadine Patient monitoring: heart rate, cardiac monitor, continuous pulse ox and blood pressure Approach: midline Location: L3-4 Injection technique: single-shot Needle Needle type: Pencan  Needle gauge: 24 G Needle length: 9 cm Assessment Sensory level: T6 Additional Notes 12 mg 0.75% Bupivacaine injected easily

## 2016-01-02 NOTE — Transfer of Care (Signed)
Immediate Anesthesia Transfer of Care Note  Patient: Lacey [name]  Procedure(s) Performed: Procedure(s): TOTAL KNEE ARTHROPLASTY (Left)  Patient Location: PACU  Anesthesia Type:Regional and Spinal  Level of Consciousness: awake, alert , oriented and patient cooperative  Airway & Oxygen Therapy: Patient Spontanous Breathing and Patient connected to nasal cannula oxygen  Post-op Assessment: Report given to RN, Post -op Vital signs reviewed and stable and Patient moving all extremities  Post vital signs: Reviewed and stable  Last Vitals:  Vitals:   01/02/16 0853  BP: 114/88  Pulse: 68  Resp: 20  Temp: 36.6 C    Last Pain:  Vitals:   01/02/16 0853  TempSrc: Oral         Complications: No apparent anesthesia complications

## 2016-01-02 NOTE — Interval H&P Note (Signed)
History and Physical Interval Note:  01/02/2016 9:38 AM  Lacey [name]  has presented today for surgery, with the diagnosis of LEFT KNEE DEGENERATIVE JOINT DISEASE  The various methods of treatment have been discussed with the patient and family. After consideration of risks, benefits and other options for treatment, the patient has consented to  Procedure(s): TOTAL KNEE ARTHROPLASTY (Left) as a surgical intervention .  The patient's history has been reviewed, patient examined, no change in status, stable for surgery.  I have reviewed the patient's chart and labs.  Questions were answered to the patient's satisfaction.     Loveta Dellis G

## 2016-01-02 NOTE — Anesthesia Preprocedure Evaluation (Signed)
Anesthesia Evaluation  Patient identified by MRN, date of birth, ID band Patient awake    Reviewed: Allergy & Precautions, NPO status , Patient's Chart, lab work & pertinent test results  Airway Mallampati: II  TM Distance: >3 FB Neck ROM: Full    Dental  (+) Teeth Intact   Pulmonary    breath sounds clear to auscultation       Cardiovascular  Rhythm:Regular Rate:Normal     Neuro/Psych    GI/Hepatic   Endo/Other    Renal/GU      Musculoskeletal   Abdominal   Peds  Hematology   Anesthesia Other Findings   Reproductive/Obstetrics                             Anesthesia Physical Anesthesia Plan  ASA: II  Anesthesia Plan: MAC, Spinal and Regional   Post-op Pain Management:    Induction: Intravenous  Airway Management Planned: Natural Airway and Simple Face Mask  Additional Equipment:   Intra-op Plan:   Post-operative Plan:   Informed Consent: I have reviewed the patients History and Physical, chart, labs and discussed the procedure including the risks, benefits and alternatives for the proposed anesthesia with the patient or authorized representative who has indicated his/her understanding and acceptance.     Plan Discussed with: CRNA and Anesthesiologist  Anesthesia Plan Comments:         Anesthesia Quick Evaluation

## 2016-01-02 NOTE — Op Note (Signed)
PREOP DIAGNOSIS: DJD LEFT KNEE POSTOP DIAGNOSIS:  same PROCEDURE: LEFT TKR ANESTHESIA: Spinal and MAC ATTENDING SURGEON: Shawanna Zanders G ASSISTANT: Loni Dolly PA  INDICATIONS FOR PROCEDURE: Lacey [name] is a 60 y.o. female who has struggled for a long time with pain due to degenerative arthritis of the left knee.  The patient has failed many conservative non-operative measures and at this point has pain which limits the ability to sleep and walk.  The patient is offered total knee replacement.  Informed operative consent was obtained after discussion of possible risks of anesthesia, infection, neurovascular injury, DVT, and death.  The importance of the post-operative rehabilitation protocol to optimize result was stressed extensively with the patient.  SUMMARY OF FINDINGS AND PROCEDURE:  Lacey [name] was taken to the operative suite where under the above anesthesia a left knee replacement was performed.  There were advanced degenerative changes and the bone quality was good.  We used the DePuy Attune system and placed size 7 femur, 7 tibia, 38 mm all polyethylene patella, and a size 6 mm spacer.  Loni Dolly PA-C assisted throughout and was invaluable to the completion of the case in that he helped retract and maintain exposure while I placed the components.  He also helped close thereby minimizing OR time.  The patient was admitted for appropriate post-op care to include perioperative antibiotics and mechanical and pharmacologic measures for DVT prophylaxis.  DESCRIPTION OF PROCEDURE:  Lacey [name] was taken to the operative suite where the above anesthesia was applied.  The patient was positioned supine and prepped and draped in normal sterile fashion.  An appropriate time out was performed.  After the administration of kefzol pre-op antibiotic the leg was elevated and exsanguinated and a tourniquet inflated.  A standard longitudinal incision was made on the anterior knee.  Dissection was  carried down to the extensor mechanism.  All appropriate anti-infective measures were used including the pre-operative antibiotic, betadine impregnated drape, and closed hooded exhaust systems for each member of the surgical team.  A medial parapatellar incision was made in the extensor mechanism and the knee cap flipped and the knee flexed.  Some residual meniscal tissues were removed along with any remaining ACL/PCL tissue.  A guide was placed on the tibia and a flat cut was made on it's superior surface.  An intramedullary guide was placed in the femur and was utilized to make anterior and posterior cuts creating an appropriate flexion gap.  A second intramedullary guide was placed in the femur to make a distal cut properly balancing the knee with an extension gap equal to the flexion gap.  The three bones sized to the above mentioned sizes and the appropriate guides were placed and utilized.  A trial reduction was done and the knee easily came to full extension and the patella tracked well on flexion.  The trial components were removed and all bones were cleaned with pulsatile lavage and then dried thoroughly.  Cement was mixed and was pressurized onto the bones followed by placement of the aforementioned components.  Excess cement was trimmed and pressure was held on the components until the cement had hardened.  The tourniquet was deflated and a small amount of bleeding was controlled with cautery and pressure.  The knee was irrigated thoroughly.  The extensor mechanism was re-approximated with V-loc suture in running fashion.  The knee was flexed and the repair was solid.  The subcutaneous tissues were re-approximated with #0 and #2-0 vicryl and the skin  closed with a subcuticular stitch and steristrips.  A sterile dressing was applied.  Intraoperative fluids, EBL, and tourniquet time can be obtained from anesthesia records.  DISPOSITION:  The patient was taken to recovery room in stable condition and  admitted for appropriate post-op care to include peri-operative antibiotic and DVT prophylaxis with mechanical and pharmacologic measures.  Elihu Milstein G 01/02/2016, 12:10 PM

## 2016-01-03 ENCOUNTER — Encounter (HOSPITAL_COMMUNITY): Payer: Self-pay | Admitting: Orthopaedic Surgery

## 2016-01-03 MED ORDER — METHOCARBAMOL 750 MG PO TABS
750.0000 mg | ORAL_TABLET | Freq: Four times a day (QID) | ORAL | Status: DC | PRN
  Administered 2016-01-03 – 2016-01-04 (×4): 750 mg via ORAL
  Filled 2016-01-03 (×4): qty 1

## 2016-01-03 MED ORDER — HYDROCODONE-ACETAMINOPHEN 5-325 MG PO TABS
1.0000 | ORAL_TABLET | ORAL | Status: DC | PRN
  Administered 2016-01-03 – 2016-01-04 (×7): 2 via ORAL
  Filled 2016-01-03 (×7): qty 2

## 2016-01-03 MED ORDER — METHOCARBAMOL 1000 MG/10ML IJ SOLN: 500.0000 mg | Freq: Four times a day (QID) | INTRAVENOUS | Status: DC | PRN

## 2016-01-03 MED ORDER — SODIUM CHLORIDE 0.9 % IV BOLUS (SEPSIS)
500.0000 mL | Freq: Once | INTRAVENOUS | Status: AC
  Administered 2016-01-03: 500 mL via INTRAVENOUS

## 2016-01-03 NOTE — Evaluation (Signed)
Physical Therapy Evaluation Patient Details Name: Lacey [name] MRN: [id number] DOB: [birth year] Today's Date: 01/03/2016   History of Present Illness  Admitted for L TKA, WBAT;  has a past medical history of Anxiety; Arthritis; GERD (gastroesophageal reflux disease); and Rotator cuff tear.  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  Clinical Impression   Pt is s/p TKA resulting in the deficits listed below (see PT Problem List). Knee is moving quite well despite pain; during walk, Lacey [name] became nauseated, lightheaded; [name], RN notified; immediately returned to room and laid down; BP 85/58, HR 54;  Pt will benefit from skilled PT to increase their independence and safety with mobility to allow discharge to the venue listed below.    At this point I believe Lacey [name] will benefit from another day inpatient to continue to monitor her activity tolerance.     Follow Up Recommendations Home health PT;Supervision/Assistance - 24 hour    Equipment Recommendations  None recommended by PT    Recommendations for Other Services OT consult     Precautions / Restrictions Precautions Precautions: Knee;Other (comment) (watch BP) Precaution Booklet Issued: Yes (comment) Precaution Comments: Pt educated to not allow any pillow or bolster under knee for healing with optimal range of motion.  Required Braces or Orthoses: Knee Immobilizer - Left Knee Immobilizer - Left: Other (comment) (KI ordered; noting nice, stable L knee in stance) Restrictions LLE Weight Bearing: Weight bearing as tolerated      Mobility  Bed Mobility Overal bed mobility: Needs Assistance Bed Mobility: Supine to Sit     Supine to sit: Min guard     General bed mobility comments: [place]inguard for safety  Transfers Overall transfer level: Needs  assistance Equipment used: Rolling walker (2 wheeled) Transfers: Sit to/from Stand Sit to Stand: Min assist         General transfer comment: Min assist mostly to guard L knee in standing; cues for hand placement and safety  Ambulation/Gait Ambulation/Gait assistance: Min guard;Min assist Ambulation Distance (Feet): 200 Feet Assistive device: Rolling walker (2 wheeled) Gait Pattern/deviations: Step-through pattern     General Gait Details: Cues to activate quad for L stance stability; halfway during walk (after stair training), [name] reported nausea, not feeling right; RN notified; immediately returned to room  Stairs Stairs: Yes Stairs assistance: Min guard Stair Management: One rail Left Number of Stairs: 2 General stair comments: cues for sequence  Wheelchair Mobility    Modified Rankin (Stroke Patients Only)       Balance                                             Pertinent Vitals/Pain Pain Assessment: 0-10 Pain Score: 9  Pain Location: L knee Pain Descriptors / Indicators: Burning;Tightness Pain Intervention(s): Monitored during session    Home Living Family/patient expects to be discharged to:: Private residence Living Arrangements: Spouse/significant other Available Help at Discharge: Family Type of Home: House Home Access: Stairs to enter Entrance Stairs-Rails: Psychiatric nurse of Steps: 3 Home Layout: One level Home Equipment: Environmental consultant - 2 wheels;Bedside commode      Prior Function Level of Independence: Independent               Hand Dominance  Extremity/Trunk Assessment   Upper Extremity Assessment Upper Extremity Assessment: Overall WFL for tasks assessed    Lower Extremity Assessment Lower Extremity Assessment: LLE deficits/detail LLE Deficits / Details: Grossly decr AROM and strength, limited by pain postop; still, good quad activation on stance       Communication   Communication:  No difficulties  Cognition Arousal/Alertness: Awake/alert Behavior During Therapy: Restless Overall Cognitive Status: Within Functional Limits for tasks assessed                      General Comments      Exercises Total Joint Exercises Quad Sets: AROM;Left;5 reps Heel Slides: AAROM;Left;5 reps   Assessment/Plan    PT Assessment Patient needs continued PT services  PT Problem List Decreased strength;Decreased range of motion;Decreased activity tolerance;Decreased balance;Decreased mobility;Decreased knowledge of use of DME;Decreased knowledge of precautions;Pain          PT Treatment Interventions DME instruction;Gait training;Stair training;Functional mobility training;Therapeutic activities;Therapeutic exercise;Balance training;Patient/family education    PT Goals (Current goals can be found in the Care Plan section)  Acute Rehab PT Goals Patient Stated Goal: walk without pain PT Goal Formulation: With patient Time For Goal Achievement: 01/10/16 Potential to Achieve Goals: Good    Frequency 7X/week   Barriers to discharge        Co-evaluation               End of Session Equipment Utilized During Treatment: Gait belt Activity Tolerance: Other (comment) (limited by nausea, dizziness, hypotensive episode) Patient left: in bed;with call bell/phone within reach;with nursing/sitter in room Nurse Communication: Mobility status         Time: 1028-1100 PT Time Calculation (min) (ACUTE ONLY): 32 min   Charges:   PT Evaluation $PT Eval Moderate Complexity: 1 Procedure PT Treatments $Gait Training: 8-22 mins   PT G Codes:        Lacey [name] 01/03/2016, 12:00 PM  Roney Marion, [place] Pager [phone] Office [phone]

## 2016-01-03 NOTE — Progress Notes (Signed)
Subjective: 1 Day Post-Op Procedure(s) (LRB): TOTAL KNEE ARTHROPLASTY (Left)  Activity level: wbat Diet tolerance:  ok Voiding:  ok Patient reports pain as mild.    Objective: Vital signs in last 24 hours: Temp:  [97.7 F (36.5 C)-98.4 F (36.9 C)] 97.7 F (36.5 C) (12/20 0453) Pulse Rate:  [45-68] 52 (12/20 0453) Resp:  [12-20] 16 (12/20 0453) BP: (88-134)/(59-88) 105/59 (12/20 0453) SpO2:  [93 %-100 %] 99 % (12/20 0453) Weight:  [68.5 kg (151 lb)] 68.5 kg (151 lb) (12/19 0918)  Labs: No results for input(s): HGB in the last 72 hours. No results for input(s): WBC, RBC, HCT, PLT in the last 72 hours. No results for input(s): NA, K, CL, CO2, BUN, CREATININE, GLUCOSE, CALCIUM in the last 72 hours. No results for input(s): LABPT, INR in the last 72 hours.  Physical Exam:  Neurologically intact ABD soft Neurovascular intact Sensation intact distally Intact pulses distally Dorsiflexion/Plantar flexion intact Incision: dressing C/D/I and no drainage No cellulitis present Compartment soft  Assessment/Plan:  1 Day Post-Op Procedure(s) (LRB): TOTAL KNEE ARTHROPLASTY (Left) Advance diet Up with therapy Discharge home with home health potentially this evening if she does well with PT today. I changed her oxy to norco as she was having some itching.  Continue on ASA 325mg  BID x 2 weeks post op. Follow up in office 2 weeks post op. I will change dressing prior to discharge.  Lacey [name], Larwance Sachs 01/03/2016, 7:44 AM

## 2016-01-03 NOTE — Progress Notes (Signed)
IV bolus in. Pt BP retaken. Pt's BP 113/79, pulse 83. Pt repts that she "feels a lot better". Will continue to monitor.

## 2016-01-03 NOTE — Progress Notes (Addendum)
Rept to [employer] PA. While ambulating, pt felt light headed, nauseated and weak. Pt repts that she felt as is she was gonning "pass out". BP 85/58 pulse 54 upon returning to bed. Pt states that she feels "better" now that she is laying down. Orders received for NS IV bolus of 500 cc. Will continue to monitor.

## 2016-01-03 NOTE — Progress Notes (Signed)
Physical Therapy Treatment Patient Details Name: Lacey [name] MRN: [id number] DOB: 10-08-1955 Today's Date: 01/03/2016    History of Present Illness Admitted for L TKA, WBAT;  has a past medical history of Anxiety; Arthritis; GERD (gastroesophageal reflux disease); and Rotator cuff tear.  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    PT Comments    Much improved activity tolerance this pm session; On track for dc home tomorrow;     01/03/16 1515  Vital Signs  Patient Position (if appropriate) Orthostatic Vitals  Orthostatic Lying   BP- Lying 113/76  Pulse- Lying 67  Orthostatic Sitting  BP- Sitting 112/75  Pulse- Sitting 68  Orthostatic Standing at 0 minutes  BP- Standing at 0 minutes 112/75  Pulse- Standing at 0 minutes 73  Orthostatic Standing at 3 minutes  BP- Standing at 3 minutes 91/58  Pulse- Standing at 3 minutes 67 (after walking the hallway)    Follow Up Recommendations  Home health PT;Supervision/Assistance - 24 hour     Equipment Recommendations  None recommended by PT    Recommendations for Other Services       Precautions / Restrictions Precautions Precautions: Knee;Other (comment) (watch BP) Precaution Booklet Issued: Yes (comment) Precaution Comments: Pt educated to not allow any pillow or bolster under knee for healing with optimal range of motion. Will need reinforcement Required Braces or Orthoses: Knee Immobilizer - Left Knee Immobilizer - Left: Other (comment) (KI ordered; noting nice, stable L knee in stance) Restrictions LLE Weight Bearing: Weight bearing as tolerated    Mobility  Bed Mobility Overal bed mobility: Needs Assistance Bed Mobility: Supine to Sit     Supine to sit: Supervision     General bed mobility comments: Supervision fro safety  Transfers Overall  transfer level: Needs assistance Equipment used: Rolling walker (2 wheeled) Transfers: Sit to/from Stand Sit to Stand: Min assist         General transfer comment: Min assist mostly to guard L knee in standing; cues for hand placement and safety  Ambulation/Gait Ambulation/Gait assistance: Min guard (without physical contact) Ambulation Distance (Feet): 120 Feet Assistive device: Rolling walker (2 wheeled) Gait Pattern/deviations: Step-through pattern     General Gait Details: Cues to activate quad for L stance stability; Cues to self-monitor for activity tolerance   Stairs            Wheelchair Mobility    Modified Rankin (Stroke Patients Only)       Balance                                    Cognition Arousal/Alertness: Awake/alert Behavior During Therapy: WFL for tasks assessed/performed Overall Cognitive Status: Within Functional Limits for tasks assessed                      Exercises Total Joint Exercises Long Arc Quad: AROM;Left;5 reps Knee Flexion: AROM;Left;5 reps Goniometric ROM: approx 5-78 deg    General Comments        Pertinent Vitals/Pain Pain Assessment: 0-10 Pain Score: 6  Pain Location: L knee Pain Descriptors / Indicators: Burning;Tightness Pain Intervention(s): Monitored during session    Home Living                      Prior  Function            PT Goals (current goals can now be found in the care plan section) Acute Rehab PT Goals Patient Stated Goal: walk without pain PT Goal Formulation: With patient Time For Goal Achievement: 01/10/16 Potential to Achieve Goals: Good Progress towards PT goals: Progressing toward goals    Frequency    7X/week      PT Plan Current plan remains appropriate    Co-evaluation             End of Session Equipment Utilized During Treatment: Gait belt Activity Tolerance: Patient tolerated treatment well Patient left: in chair;with call  bell/phone within reach     Time: [phone] (start and end times are approximate) PT Time Calculation (min) (ACUTE ONLY): 30 min  Charges:  $Gait Training: 8-22 mins $Therapeutic Activity: 8-22 mins                    G Codes:      Colletta Maryland January 23, 2016, 4:49 PM  Roney Marion, [place] Pager [phone] Office [phone]

## 2016-01-03 NOTE — Anesthesia Postprocedure Evaluation (Signed)
Anesthesia Post Note  Patient: Lacey [name]  Procedure(s) Performed: Procedure(s) (LRB): TOTAL KNEE ARTHROPLASTY (Left)  Anesthesia Type: MAC and Spinal Level of consciousness: awake, awake and alert and oriented Pain management: pain level controlled Vital Signs Assessment: post-procedure vital signs reviewed and stable Respiratory status: spontaneous breathing, nonlabored ventilation and respiratory function stable Cardiovascular status: blood pressure returned to baseline Anesthetic complications: no       Last Vitals:  Vitals:   01/03/16 0453 01/03/16 1230  BP: (!) 105/59 113/79  Pulse: (!) 52 88  Resp: 16 17  Temp: 36.5 C 36.9 C    Last Pain:  Vitals:   01/03/16 1617  TempSrc:   PainSc: 7                  Bhavika Schnider COKER

## 2016-01-03 NOTE — Clinical Social Work Note (Signed)
CSW consulted for New SNF. PT is recommending HHPT. CSW updated RNCM. CSW is signing off as no further needs identified.   Darden Dates, MSW, LCSW  Clinical Social Worker [phone]

## 2016-01-04 MED ORDER — PROMETHAZINE HCL 12.5 MG PO TABS: 12.5000 mg | ORAL_TABLET | Freq: Four times a day (QID) | ORAL | 0 refills | Status: DC | PRN

## 2016-01-04 MED ORDER — HYDROCODONE-ACETAMINOPHEN 5-325 MG PO TABS: 1.0000 | ORAL_TABLET | ORAL | 0 refills | Status: DC | PRN

## 2016-01-04 MED ORDER — ALPRAZOLAM 0.5 MG PO TABS: 0.5000 mg | ORAL_TABLET | Freq: Three times a day (TID) | ORAL | 0 refills | Status: DC | PRN

## 2016-01-04 MED ORDER — ASPIRIN 325 MG PO TBEC: 325.0000 mg | DELAYED_RELEASE_TABLET | Freq: Two times a day (BID) | ORAL | 0 refills | Status: DC

## 2016-01-04 MED ORDER — METHOCARBAMOL 750 MG PO TABS: 750.0000 mg | ORAL_TABLET | Freq: Four times a day (QID) | ORAL | 0 refills | Status: DC | PRN

## 2016-01-04 MED FILL — METHOCARBAMOL 750 MG TABLET: 750 | 12 days supply | Qty: 50 | Fill #0

## 2016-01-04 MED FILL — ALPRAZolam 0.5 MG TABS: 0.5 | 13 days supply | Qty: 40 | Fill #0

## 2016-01-04 MED FILL — HYDROCODON-APAP 5-325: 5-325 | 4 days supply | Qty: 50 | Fill #0

## 2016-01-04 MED FILL — ASPIRIN EC 325 MG TABLET: 325 | 15 days supply | Qty: 30 | Fill #0

## 2016-01-04 MED FILL — PROMETHAZINE 12.5 MG TABLET: 12.5 | 5 days supply | Qty: 40 | Fill #0

## 2016-01-04 NOTE — Evaluation (Signed)
Occupational Therapy Evaluation Patient Details Name: Lacey [name] MRN: [id number] DOB: July 29, 1955 Today's Date: 01/04/2016    History of Present Illness Admitted for L TKA, WBAT;  has a past medical history of Anxiety; Arthritis; GERD (gastroesophageal reflux disease); and Rotator cuff tear.  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   Clinical Impression   Pt. Is setup to S with ADLs without AE. Pt. Is S with toilet transfer and shower transer with d3-1 commode. Pt. Husband can assist at this level of care. Pt. Is in agreement that she does not need any further OT in this setting or at home. Pt. Was ed on walker basket to use to carry items.     Follow Up Recommendations  No OT follow up    Equipment Recommendations  None recommended by OT    Recommendations for Other Services       Precautions / Restrictions Precautions Precautions: Knee;Other (comment) (watch BP) Required Braces or Orthoses: Knee Immobilizer - Left Restrictions Weight Bearing Restrictions: Yes LLE Weight Bearing: Weight bearing as tolerated      Mobility Bed Mobility Overal bed mobility: Modified Independent Bed Mobility: Supine to Sit              Transfers       Sit to Stand: Supervision              Balance                                            ADL Overall ADL's : Needs assistance/impaired Eating/Feeding: Independent   Grooming: Wash/dry hands;Wash/dry face;Modified independent;Standing   Upper Body Bathing: Set up;Sitting   Lower Body Bathing: Set up;Sit to/from stand   Upper Body Dressing : Set up;Sitting   Lower Body Dressing: Set up;Sit to/from stand   Toilet Transfer: Supervision/safety;Ambulation;BSC   Toileting- Clothing Manipulation and Hygiene: Modified  independent;Sit to/from stand   Tub/ Shower Transfer: Supervision/safety;Ambulation;3 in 1   Functional mobility during ADLs: Supervision/safety;Rolling walker General ADL Comments: Pt. is able to perform ADLs with setup/S which husband can provide.     Vision     Perception     Praxis      Pertinent Vitals/Pain Pain Assessment: 0-10 Pain Score: 5  Pain Location: R knee and theigh Pain Descriptors / Indicators: Constant;Stabbing Pain Intervention(s): Premedicated before session     Hand Dominance Right   Extremity/Trunk Assessment Upper Extremity Assessment Upper Extremity Assessment: Overall WFL for tasks assessed           Communication Communication Communication: No difficulties   Cognition Arousal/Alertness: Awake/alert Behavior During Therapy: WFL for tasks assessed/performed Overall Cognitive Status: Within Functional Limits for tasks assessed                     General Comments       Exercises       Shoulder Instructions      Home Living Family/patient expects to be discharged to:: Private residence Living Arrangements: Spouse/significant other Available Help at Discharge: Family Type of Home: House Home Access: Stairs to enter Technical brewer of Steps: 3 Entrance Stairs-Rails: Right;Left Home Layout: One level     Bathroom Shower/Tub: Occupational psychologist: Standard  Home Equipment: [place] - 2 wheels;Bedside commode;Shower seat;Hand held shower head          Prior Functioning/Environment Level of Independence: Independent                 OT Problem List:     OT Treatment/Interventions:      OT Goals(Current goals can be found in the care plan section) Acute Rehab OT Goals Patient Stated Goal: go home  OT Frequency:     Barriers to D/C:            Co-evaluation              End of Session Equipment Utilized During Treatment: Gait belt;Rolling walker  Activity Tolerance: Patient  tolerated treatment well Patient left: in chair;with call bell/phone within reach   Time: 0720-0754 OT Time Calculation (min): 34 min Charges:    G-Codes:    Lacey [name] 01/13/16, 7:55 AM

## 2016-01-04 NOTE — Progress Notes (Signed)
Subjective: 2 Days Post-Op Procedure(s) (LRB): TOTAL KNEE ARTHROPLASTY (Left)  Activity level:  wbat Diet tolerance:  ok Voiding:  ok Patient reports pain as mild.    Objective: Vital signs in last 24 hours: Temp:  [97.8 F (36.6 C)-98.6 F (37 C)] 97.8 F (36.6 C) (12/21 0442) Pulse Rate:  [72-88] 72 (12/21 0442) Resp:  [16-18] 18 (12/21 0442) BP: (108-122)/(59-79) 122/63 (12/21 0442) SpO2:  [94 %-97 %] 94 % (12/21 0442)  Labs: No results for input(s): HGB in the last 72 hours. No results for input(s): WBC, RBC, HCT, PLT in the last 72 hours. No results for input(s): NA, K, CL, CO2, BUN, CREATININE, GLUCOSE, CALCIUM in the last 72 hours. No results for input(s): LABPT, INR in the last 72 hours.  Physical Exam:  Neurologically intact ABD soft Neurovascular intact Sensation intact distally Intact pulses distally Dorsiflexion/Plantar flexion intact Incision: dressing C/D/I and no drainage No cellulitis present Compartment soft  Assessment/Plan:  2 Days Post-Op Procedure(s) (LRB): TOTAL KNEE ARTHROPLASTY (Left) Advance diet Up with therapy Discharge home with home health today after PT this morning.  I changed dressing to aquacel today. Continue on ASA 325mg  BID x 2 weeks Follow up in office 2 weeks.   Eveleen Mcnear, Larwance Sachs 01/04/2016, 6:44 AM

## 2016-01-04 NOTE — Care Management Note (Signed)
Case Management Note  Patient Details  Name: Lacey [name] MRN: [id number] Date of Birth: 12-Jun-1955  Subjective/Objective:  60 yr old female s/p left total arthroplasty.                   Action/Plan: Case manager spoke with patient and her husband concerning home health and DME needs. Patient was preoperatively setup with [place], no changes. Patient states RW,3in1 and CPM have been delivered to their home. She will have family support at discharge.    Expected Discharge Date:   01/04/16               Expected Discharge Plan:  [place]  In-House Referral:  NA  Discharge planning Services  CM Consult  Post Acute Care Choice:  Home Health, Durable Medical Equipment Choice offered to:  Patient  DME Arranged:  3-N-1, CPM, Walker rolling DME Agency:  [employer]  HH Arranged:  PT [place]:  [place]  Status of Service:  Completed, signed off  If discussed at [place] of Stay Meetings, dates discussed:    Additional Comments:  Ninfa Meeker, RN 01/04/2016, 10:49 AM

## 2016-01-04 NOTE — Progress Notes (Signed)
Physical Therapy Treatment Patient Details Name: Lacey [name] MRN: [id number] DOB: 1955/05/15 Today's Date: 01/04/2016    History of Present Illness Admitted for L TKA, WBAT;  has a past medical history of Anxiety; Arthritis; GERD (gastroesophageal reflux disease); and Rotator cuff tear.  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    PT Comments    Patient is making good progress with PT.  From a mobility standpoint anticipate patient will be ready for DC home when medically ready.     Follow Up Recommendations  Home health PT;Supervision/Assistance - 24 hour     Equipment Recommendations  None recommended by PT    Recommendations for Other Services       Precautions / Restrictions Precautions Precautions: Knee Required Braces or Orthoses: Knee Immobilizer - Left Restrictions Weight Bearing Restrictions: Yes LLE Weight Bearing: Weight bearing as tolerated    Mobility  Bed Mobility Overal bed mobility: Modified Independent Bed Mobility: Supine to Sit           General bed mobility comments: increased time and effort  Transfers Overall transfer level: Needs assistance Equipment used: Rolling walker (2 wheeled) Transfers: Sit to/from Stand Sit to Stand: Supervision         General transfer comment: supervision for safety  Ambulation/Gait Ambulation/Gait assistance: Supervision Ambulation Distance (Feet): 225 Feet Assistive device: Rolling walker (2 wheeled) Gait Pattern/deviations: Step-through pattern;Decreased weight shift to left     General Gait Details: cues for sequencing and proximity of RW; pt with steady gait and symmetrical step lengths   Stairs Stairs: Yes   Stair Management: One rail Left Number of Stairs:  (3 steps then 2 steps) General stair comments: cues for sequencing  and technique with L hand rail as pt cannot reach bilat rails at same time  Wheelchair Mobility    Modified Rankin (Stroke Patients Only)       Balance                                    Cognition Arousal/Alertness: Awake/alert Behavior During Therapy: WFL for tasks assessed/performed Overall Cognitive Status: Within Functional Limits for tasks assessed                      Exercises Total Joint Exercises Quad Sets: AROM;Left;10 reps Heel Slides: AROM;Left;10 reps Hip ABduction/ADduction: AROM;Left;10 reps Straight Leg Raises: AAROM;Left;10 reps Long Arc Quad: Left;AAROM;10 reps Knee Flexion: AROM;Left;5 reps Goniometric ROM: 5-85    General Comments        Pertinent Vitals/Pain Pain Assessment: 0-10 Pain Score: 7  Pain Location: L knee Pain Descriptors / Indicators: Guarding;Grimacing;Sore Pain Intervention(s): Limited activity within patient's tolerance;Monitored during session;Repositioned;RN gave pain meds during session;Ice applied    Home Living Family/patient expects to be discharged to:: Private residence Living Arrangements: Spouse/significant other Available Help at Discharge: Family Type of Home: House Home Access: Stairs to enter Entrance Stairs-Rails: Right;Left Home Layout: One level Home Equipment: Environmental consultant - 2 wheels;Bedside commode;Shower seat;Hand held shower head      Prior Function Level of Independence: Independent          PT Goals (current goals can now be found in the care plan section) Acute Rehab PT Goals Patient Stated Goal: go home Progress towards PT goals: Progressing toward goals  Frequency    7X/week      PT Plan Current plan remains appropriate    Co-evaluation             End of Session Equipment Utilized During Treatment: Gait belt Activity Tolerance: Patient tolerated treatment well Patient left: in chair;with call bell/phone within reach     Time: 0933-1004 PT Time  Calculation (min) (ACUTE ONLY): 31 min  Charges:  $Gait Training: 8-22 mins $Therapeutic Exercise: 8-22 mins                    G Codes:      [place] April, PTA Pager: [phone]   01/04/2016, 10:11 AM

## 2016-01-04 NOTE — Discharge Summary (Signed)
Patient ID: NICTE SORBER MRN: [id number] DOB/AGE: 03-21-1955 60 y.o.  Admit date: 01/02/2016 Discharge date: 01/04/2016  Admission Diagnoses:  Principal Problem:   Primary osteoarthritis of left knee   Discharge Diagnoses:  Same  Past Medical History:  Diagnosis Date  . Anxiety   . Arthritis   . GERD (gastroesophageal reflux disease)   . Rotator cuff tear     Surgeries: Procedure(s): TOTAL KNEE ARTHROPLASTY on 01/02/2016   Consultants:   Discharged Condition: Improved  Hospital Course: AUNYA GERACE is an 60 y.o. female who was admitted 01/02/2016 for operative treatment ofPrimary osteoarthritis of left knee. Patient has severe unremitting pain that affects sleep, daily activities, and work/hobbies. After pre-op clearance the patient was taken to the operating room on 01/02/2016 and underwent  Procedure(s): TOTAL KNEE ARTHROPLASTY.    Patient was given perioperative antibiotics: Anti-infectives    Start     Dose/Rate Route Frequency Ordered Stop   01/02/16 1800  ceFAZolin (ANCEF) IVPB 2g/100 mL premix     2 g 200 mL/hr over 30 Minutes Intravenous Every 6 hours 01/02/16 1658 01/03/16 0027   01/02/16 0902  ceFAZolin (ANCEF) 2-4 GM/100ML-% IVPB    Comments:  Starleen Arms   : cabinet override      01/02/16 0902 01/02/16 1037   01/02/16 0900  ceFAZolin (ANCEF) IVPB 2g/100 mL premix     2 g 200 mL/hr over 30 Minutes Intravenous On call to O.R. 01/02/16 0900 01/02/16 1107       Patient was given sequential compression devices, early ambulation, and chemoprophylaxis to prevent DVT.  Patient benefited maximally from hospital stay and there were no complications.    Recent vital signs: Patient Vitals for the past 24 hrs:  BP Temp Temp src Pulse Resp SpO2  01/04/16 0442 122/63 97.8 F (36.6 C) Axillary 72 18 94 %  01/03/16 2021 (!) 108/59 98.6 F (37 C) Oral 73 16 97 %  01/03/16 1230 113/79 98.5 F (36.9 C) Oral 88 17 -  01/03/16 0730 - - - - - 94 %      Recent laboratory studies: No results for input(s): WBC, HGB, HCT, PLT, NA, K, CL, CO2, BUN, CREATININE, GLUCOSE, INR, CALCIUM in the last 72 hours.  Invalid input(s): PT, 2   Discharge Medications:   Allergies as of 01/04/2016      Reactions   Morphine And Related Other (See Comments)   "feels crazy"      Medication List    STOP taking these medications   celecoxib 200 MG capsule Commonly known as:  CELEBREX     TAKE these medications   ALPRAZolam 0.5 MG tablet Commonly known as:  XANAX Take 1 tablet (0.5 mg total) by mouth 3 (three) times daily as needed for anxiety. What changed:  when to take this   aspirin 325 MG EC tablet Take 1 tablet (325 mg total) by mouth 2 (two) times daily after a meal.   B COMPLEX-VITAMIN B12 PO Take 1 tablet by mouth daily.   cycloSPORINE 0.05 % ophthalmic emulsion Commonly known as:  RESTASIS Place 1 drop into both eyes 2 (two) times daily.   diphenhydrAMINE 25 MG tablet Commonly known as:  BENADRYL Take 50 mg by mouth at bedtime.   DULoxetine 20 MG capsule Commonly known as:  CYMBALTA Take 1 capsule (20 mg total) by mouth daily. What changed:  when to take this   HYDROcodone-acetaminophen 5-325 MG tablet Commonly known as:  NORCO/VICODIN Take 1-2 tablets by mouth every  4 (four) hours as needed for moderate pain. What changed:  when to take this   methocarbamol 750 MG tablet Commonly known as:  ROBAXIN Take 1 tablet (750 mg total) by mouth every 6 (six) hours as needed for muscle spasms.   promethazine 12.5 MG tablet Commonly known as:  PHENERGAN Take 1-2 tablets (12.5-25 mg total) by mouth every 6 (six) hours as needed for nausea or vomiting.   vitamin C 500 MG tablet Commonly known as:  ASCORBIC ACID Take 500 mg by mouth daily as needed (immune support).            Durable Medical Equipment        Start     Ordered   01/02/16 1659  DME Walker rolling  Once    Question:  Patient needs a walker to treat with  the following condition  Answer:  Primary osteoarthritis of left knee   01/02/16 1658   01/02/16 1659  DME 3 n 1  Once     01/02/16 1658   01/02/16 1659  DME Bedside commode  Once    Question:  Patient needs a bedside commode to treat with the following condition  Answer:  Primary osteoarthritis of left knee   01/02/16 1658      Diagnostic Studies: Dg Chest 2 View  Result Date: 12/25/2015 CLINICAL DATA:  Preoperative examination prior to left total knee replacement. No cardiopulmonary history. Nonsmoker. EXAM: CHEST  2 VIEW COMPARISON:  Chest x-ray of November 01, 2013 and February 14, 2005. FINDINGS: The lungs are adequately inflated. There is no focal infiltrate. There is stable scarring in the left lung likely in the lingula laterally. The heart and pulmonary vascularity are normal. The mediastinum is normal in width. There is no pleural effusion. The bony thorax exhibits no acute abnormality. IMPRESSION: There is no active cardiopulmonary disease. Electronically Signed   By: David  Martinique M.D.   On: 12/25/2015 16:30    Disposition: 01-Home or Self Care  Discharge Instructions    Call MD / Call 911    Complete by:  As directed    If you experience chest pain or shortness of breath, CALL 911 and be transported to the hospital emergency room.  If you develope a fever above 101 F, pus (white drainage) or increased drainage or redness at the wound, or calf pain, call your surgeon's office.   Constipation Prevention    Complete by:  As directed    Drink plenty of fluids.  Prune juice may be helpful.  You may use a stool softener, such as Colace (over the counter) 100 mg twice a day.  Use MiraLax (over the counter) for constipation as needed.   Diet - low sodium heart healthy    Complete by:  As directed    Discharge instructions    Complete by:  As directed    INSTRUCTIONS AFTER JOINT REPLACEMENT   Remove items at home which could result in a fall. This includes throw rugs or furniture in  walking pathways ICE to the affected joint every three hours while awake for 30 minutes at a time, for at least the first 3-5 days, and then as needed for pain and swelling.  Continue to use ice for pain and swelling. You may notice swelling that will progress down to the foot and ankle.  This is normal after surgery.  Elevate your leg when you are not up walking on it.   Continue to use the breathing machine you got  in the hospital (incentive spirometer) which will help keep your temperature down.  It is common for your temperature to cycle up and down following surgery, especially at night when you are not up moving around and exerting yourself.  The breathing machine keeps your lungs expanded and your temperature down.   DIET:  As you were doing prior to hospitalization, we recommend a well-balanced diet.  DRESSING / WOUND CARE / SHOWERING  You may shower 3 days after surgery, but keep the wounds dry during showering.  You may use an occlusive plastic wrap (Press'n Seal for example), NO SOAKING/SUBMERGING IN THE BATHTUB.  If the bandage gets wet, change with a clean dry gauze.  If the incision gets wet, pat the wound dry with a clean towel.  ACTIVITY  Increase activity slowly as tolerated, but follow the weight bearing instructions below.   No driving for 6 weeks or until further direction given by your physician.  You cannot drive while taking narcotics.  No lifting or carrying greater than 10 lbs. until further directed by your surgeon. Avoid periods of inactivity such as sitting longer than an hour when not asleep. This helps prevent blood clots.  You may return to work once you are authorized by your doctor.     WEIGHT BEARING   Weight bearing as tolerated with assist device (walker, cane, etc) as directed, use it as long as suggested by your surgeon or therapist, typically at least 4-6 weeks.   EXERCISES  Results after joint replacement surgery are often greatly improved when you  follow the exercise, range of motion and muscle strengthening exercises prescribed by your doctor. Safety measures are also important to protect the joint from further injury. Any time any of these exercises cause you to have increased pain or swelling, decrease what you are doing until you are comfortable again and then slowly increase them. If you have problems or questions, call your caregiver or physical therapist for advice.   Rehabilitation is important following a joint replacement. After just a few days of immobilization, the muscles of the leg can become weakened and shrink (atrophy).  These exercises are designed to build up the tone and strength of the thigh and leg muscles and to improve motion. Often times heat used for twenty to thirty minutes before working out will loosen up your tissues and help with improving the range of motion but do not use heat for the first two weeks following surgery (sometimes heat can increase post-operative swelling).   These exercises can be done on a training (exercise) mat, on the floor, on a table or on a bed. Use whatever works the best and is most comfortable for you.    Use music or television while you are exercising so that the exercises are a pleasant break in your day. This will make your life better with the exercises acting as a break in your routine that you can look forward to.   Perform all exercises about fifteen times, three times per day or as directed.  You should exercise both the operative leg and the other leg as well.   Exercises include:   Quad Sets - Tighten up the muscle on the front of the thigh (Quad) and hold for 5-10 seconds.   Straight Leg Raises - With your knee straight (if you were given a brace, keep it on), lift the leg to 60 degrees, hold for 3 seconds, and slowly lower the leg.  Perform this exercise against resistance  later as your leg gets stronger.  Leg Slides: Lying on your back, slowly slide your foot toward your  buttocks, bending your knee up off the floor (only go as far as is comfortable). Then slowly slide your foot back down until your leg is flat on the floor again.  Angel Wings: Lying on your back spread your legs to the side as far apart as you can without causing discomfort.  Hamstring Strength:  Lying on your back, push your heel against the floor with your leg straight by tightening up the muscles of your buttocks.  Repeat, but this time bend your knee to a comfortable angle, and push your heel against the floor.  You may put a pillow under the heel to make it more comfortable if necessary.   A rehabilitation program following joint replacement surgery can speed recovery and prevent re-injury in the future due to weakened muscles. Contact your doctor or a physical therapist for more information on knee rehabilitation.    CONSTIPATION  Constipation is defined medically as fewer than three stools per week and severe constipation as less than one stool per week.  Even if you have a regular bowel pattern at home, your normal regimen is likely to be disrupted due to multiple reasons following surgery.  Combination of anesthesia, postoperative narcotics, change in appetite and fluid intake all can affect your bowels.   YOU MUST use at least one of the following options; they are listed in order of increasing strength to get the job done.  They are all available over the counter, and you may need to use some, POSSIBLY even all of these options:    Drink plenty of fluids (prune juice may be helpful) and high fiber foods Colace 100 mg by mouth twice a day  Senokot for constipation as directed and as needed Dulcolax (bisacodyl), take with full glass of water  Miralax (polyethylene glycol) once or twice a day as needed.  If you have tried all these things and are unable to have a bowel movement in the first 3-4 days after surgery call either your surgeon or your primary doctor.    If you experience loose  stools or diarrhea, hold the medications until you stool forms back up.  If your symptoms do not get better within 1 week or if they get worse, check with your doctor.  If you experience "the worst abdominal pain ever" or develop nausea or vomiting, please contact the office immediately for further recommendations for treatment.   ITCHING:  If you experience itching with your medications, try taking only a single pain pill, or even half a pain pill at a time.  You can also use Benadryl over the counter for itching or also to help with sleep.   TED HOSE STOCKINGS:  Use stockings on both legs until for at least 2 weeks or as directed by physician office. They may be removed at night for sleeping.  MEDICATIONS:  See your medication summary on the "After Visit Summary" that nursing will review with you.  You may have some home medications which will be placed on hold until you complete the course of blood thinner medication.  It is important for you to complete the blood thinner medication as prescribed.  PRECAUTIONS:  If you experience chest pain or shortness of breath - call 911 immediately for transfer to the hospital emergency department.   If you develop a fever greater that 101 F, purulent drainage from wound, increased redness or  drainage from wound, foul odor from the wound/dressing, or calf pain - CONTACT YOUR SURGEON.                                                   FOLLOW-UP APPOINTMENTS:  If you do not already have a post-op appointment, please call the office for an appointment to be seen by your surgeon.  Guidelines for how soon to be seen are listed in your "After Visit Summary", but are typically between 1-4 weeks after surgery.  OTHER INSTRUCTIONS:   Knee Replacement:  Do not place pillow under knee, focus on keeping the knee straight while resting. CPM instructions: 0-90 degrees, 2 hours in the morning, 2 hours in the afternoon, and 2 hours in the evening. Place foam block, curve side  up under heel at all times except when in CPM or when walking.  DO NOT modify, tear, cut, or change the foam block in any way.  MAKE SURE YOU:  Understand these instructions.  Get help right away if you are not doing well or get worse.    Thank you for letting us be a part of your medical care team.  It is a privilege we respect greatly.  We hope these instructions will help you stay on track for a fast and full recovery!   Increase activity slowly as tolerated    Complete by:  As directed       Follow-up Information    DALLDORF,PETER G, MD. Schedule an appointment as soon as possible for a visit in 2 weeks.   Specialty:  Orthopedic Surgery Contact information: [place] [id number] [phone]            Signed: Rich Fuchs 01/04/2016, 6:41 AM

## 2016-01-04 NOTE — Consult Note (Signed)
   [facility] CM Inpatient Consult   01/04/2016  [place] 12/16/1955 [id number]    Came to floor to see [employer] member on behalf of [employer] [facility] Care Management program for [employer] employees/dependents with [facility] insurance. She was discharged prior to bedside visit.    Marthenia Rolling, MSN-Ed, RN,BSN [facility] Liaison [phone]

## 2016-01-05 DIAGNOSIS — M150 Primary generalized (osteo)arthritis: Secondary | ICD-10-CM | POA: Diagnosis not present

## 2016-01-05 DIAGNOSIS — Z96652 Presence of left artificial knee joint: Secondary | ICD-10-CM | POA: Diagnosis not present

## 2016-01-05 DIAGNOSIS — Z471 Aftercare following joint replacement surgery: Secondary | ICD-10-CM | POA: Diagnosis not present

## 2016-01-06 DIAGNOSIS — M150 Primary generalized (osteo)arthritis: Secondary | ICD-10-CM | POA: Diagnosis not present

## 2016-01-06 DIAGNOSIS — Z96652 Presence of left artificial knee joint: Secondary | ICD-10-CM | POA: Diagnosis not present

## 2016-01-06 DIAGNOSIS — Z471 Aftercare following joint replacement surgery: Secondary | ICD-10-CM | POA: Diagnosis not present

## 2016-01-09 MED FILL — DULoxetine HCL 20 MG CPEP: 20 | 90 days supply | Qty: 90 | Fill #0

## 2016-01-10 DIAGNOSIS — Z96652 Presence of left artificial knee joint: Secondary | ICD-10-CM | POA: Diagnosis not present

## 2016-01-10 DIAGNOSIS — M150 Primary generalized (osteo)arthritis: Secondary | ICD-10-CM | POA: Diagnosis not present

## 2016-01-10 DIAGNOSIS — Z471 Aftercare following joint replacement surgery: Secondary | ICD-10-CM | POA: Diagnosis not present

## 2016-01-11 DIAGNOSIS — M150 Primary generalized (osteo)arthritis: Secondary | ICD-10-CM | POA: Diagnosis not present

## 2016-01-11 DIAGNOSIS — Z471 Aftercare following joint replacement surgery: Secondary | ICD-10-CM | POA: Diagnosis not present

## 2016-01-11 DIAGNOSIS — Z96652 Presence of left artificial knee joint: Secondary | ICD-10-CM | POA: Diagnosis not present

## 2016-01-15 DIAGNOSIS — M150 Primary generalized (osteo)arthritis: Secondary | ICD-10-CM | POA: Diagnosis not present

## 2016-01-15 DIAGNOSIS — M1712 Unilateral primary osteoarthritis, left knee: Secondary | ICD-10-CM | POA: Diagnosis not present

## 2016-01-15 DIAGNOSIS — Z96652 Presence of left artificial knee joint: Secondary | ICD-10-CM | POA: Diagnosis not present

## 2016-01-15 DIAGNOSIS — Z471 Aftercare following joint replacement surgery: Secondary | ICD-10-CM | POA: Diagnosis not present

## 2016-01-17 DIAGNOSIS — M1712 Unilateral primary osteoarthritis, left knee: Secondary | ICD-10-CM | POA: Diagnosis not present

## 2016-01-17 MED FILL — HYDROCODON-APAP 5-325: 5-325 | 10 days supply | Qty: 30 | Fill #0

## 2016-01-18 DIAGNOSIS — M25562 Pain in left knee: Secondary | ICD-10-CM | POA: Diagnosis not present

## 2016-01-18 DIAGNOSIS — Z96652 Presence of left artificial knee joint: Secondary | ICD-10-CM | POA: Diagnosis not present

## 2016-01-18 DIAGNOSIS — Z471 Aftercare following joint replacement surgery: Secondary | ICD-10-CM | POA: Diagnosis not present

## 2016-01-18 DIAGNOSIS — R262 Difficulty in walking, not elsewhere classified: Secondary | ICD-10-CM | POA: Diagnosis not present

## 2016-01-23 DIAGNOSIS — M25562 Pain in left knee: Secondary | ICD-10-CM | POA: Diagnosis not present

## 2016-01-23 DIAGNOSIS — R262 Difficulty in walking, not elsewhere classified: Secondary | ICD-10-CM | POA: Diagnosis not present

## 2016-01-23 DIAGNOSIS — Z471 Aftercare following joint replacement surgery: Secondary | ICD-10-CM | POA: Diagnosis not present

## 2016-01-23 DIAGNOSIS — Z96652 Presence of left artificial knee joint: Secondary | ICD-10-CM | POA: Diagnosis not present

## 2016-01-25 DIAGNOSIS — M25562 Pain in left knee: Secondary | ICD-10-CM | POA: Diagnosis not present

## 2016-01-25 DIAGNOSIS — Z471 Aftercare following joint replacement surgery: Secondary | ICD-10-CM | POA: Diagnosis not present

## 2016-01-25 DIAGNOSIS — R262 Difficulty in walking, not elsewhere classified: Secondary | ICD-10-CM | POA: Diagnosis not present

## 2016-01-25 DIAGNOSIS — Z96652 Presence of left artificial knee joint: Secondary | ICD-10-CM | POA: Diagnosis not present

## 2016-01-30 DIAGNOSIS — Z471 Aftercare following joint replacement surgery: Secondary | ICD-10-CM | POA: Diagnosis not present

## 2016-01-30 DIAGNOSIS — R262 Difficulty in walking, not elsewhere classified: Secondary | ICD-10-CM | POA: Diagnosis not present

## 2016-01-30 DIAGNOSIS — M25562 Pain in left knee: Secondary | ICD-10-CM | POA: Diagnosis not present

## 2016-01-30 DIAGNOSIS — Z96652 Presence of left artificial knee joint: Secondary | ICD-10-CM | POA: Diagnosis not present

## 2016-01-31 DIAGNOSIS — Z471 Aftercare following joint replacement surgery: Secondary | ICD-10-CM | POA: Diagnosis not present

## 2016-01-31 DIAGNOSIS — M25562 Pain in left knee: Secondary | ICD-10-CM | POA: Diagnosis not present

## 2016-01-31 DIAGNOSIS — Z96653 Presence of artificial knee joint, bilateral: Secondary | ICD-10-CM | POA: Diagnosis not present

## 2016-01-31 MED FILL — HYDROCODON-APAP 5-325: 5-325 | 10 days supply | Qty: 30 | Fill #0

## 2016-02-02 DIAGNOSIS — M25562 Pain in left knee: Secondary | ICD-10-CM | POA: Diagnosis not present

## 2016-02-02 DIAGNOSIS — Z471 Aftercare following joint replacement surgery: Secondary | ICD-10-CM | POA: Diagnosis not present

## 2016-02-02 DIAGNOSIS — Z96652 Presence of left artificial knee joint: Secondary | ICD-10-CM | POA: Diagnosis not present

## 2016-02-02 DIAGNOSIS — R262 Difficulty in walking, not elsewhere classified: Secondary | ICD-10-CM | POA: Diagnosis not present

## 2016-02-13 DIAGNOSIS — Z471 Aftercare following joint replacement surgery: Secondary | ICD-10-CM | POA: Diagnosis not present

## 2016-02-13 DIAGNOSIS — R262 Difficulty in walking, not elsewhere classified: Secondary | ICD-10-CM | POA: Diagnosis not present

## 2016-02-13 DIAGNOSIS — Z96652 Presence of left artificial knee joint: Secondary | ICD-10-CM | POA: Diagnosis not present

## 2016-02-13 DIAGNOSIS — M25562 Pain in left knee: Secondary | ICD-10-CM | POA: Diagnosis not present

## 2016-02-15 DIAGNOSIS — M25562 Pain in left knee: Secondary | ICD-10-CM | POA: Diagnosis not present

## 2016-02-15 DIAGNOSIS — R262 Difficulty in walking, not elsewhere classified: Secondary | ICD-10-CM | POA: Diagnosis not present

## 2016-02-15 DIAGNOSIS — Z96652 Presence of left artificial knee joint: Secondary | ICD-10-CM | POA: Diagnosis not present

## 2016-02-15 DIAGNOSIS — Z471 Aftercare following joint replacement surgery: Secondary | ICD-10-CM | POA: Diagnosis not present

## 2016-02-19 DIAGNOSIS — Z471 Aftercare following joint replacement surgery: Secondary | ICD-10-CM | POA: Diagnosis not present

## 2016-02-19 DIAGNOSIS — R262 Difficulty in walking, not elsewhere classified: Secondary | ICD-10-CM | POA: Diagnosis not present

## 2016-02-19 DIAGNOSIS — Z96652 Presence of left artificial knee joint: Secondary | ICD-10-CM | POA: Diagnosis not present

## 2016-02-19 DIAGNOSIS — M25562 Pain in left knee: Secondary | ICD-10-CM | POA: Diagnosis not present

## 2016-03-13 DIAGNOSIS — M25562 Pain in left knee: Secondary | ICD-10-CM | POA: Diagnosis not present

## 2016-03-13 MED FILL — HYDROCODON-APAP 5-325: 5-325 | 15 days supply | Qty: 30 | Fill #0

## 2016-04-16 MED FILL — DULoxetine HCL 20 MG CPEP: 20 | 90 days supply | Qty: 90 | Fill #1

## 2016-04-24 ENCOUNTER — Other Ambulatory Visit: Payer: Self-pay | Admitting: Family Medicine

## 2016-04-24 NOTE — Telephone Encounter (Signed)
Pt request a med. refill on her ALPRAZolam (XANAX) 0.5 MG tablet  and DULoxetine (CYMBALTA) 20 MG capsule.  Pt contact phone number [phone]

## 2016-04-24 NOTE — Telephone Encounter (Signed)
12/2015 last refills

## 2016-04-29 MED ORDER — DULOXETINE HCL 20 MG PO CPEP: 20.0000 mg | ORAL_CAPSULE | Freq: Every day | ORAL | 0 refills | Status: DC

## 2016-04-29 NOTE — Telephone Encounter (Signed)
Call --- I approved a 30 day supply of Cymbalta for patient to allow her time to schedule OV with me for follow-up.  Last visit with me 07/2015.  I did not approve Xanax refill.  She must be seen to receive a refill of Xanax.  Please schedule OV with me.

## 2016-05-04 ENCOUNTER — Other Ambulatory Visit: Payer: Self-pay

## 2016-05-04 NOTE — Telephone Encounter (Signed)
01/04/2016 Last refill different md

## 2016-05-06 NOTE — Telephone Encounter (Signed)
?   I am not sure what this is about. I have never received a message through here before.

## 2016-05-10 DIAGNOSIS — M25562 Pain in left knee: Secondary | ICD-10-CM | POA: Diagnosis not present

## 2016-05-14 NOTE — Telephone Encounter (Signed)
Last ov 07/04/15.

## 2016-05-14 NOTE — Telephone Encounter (Signed)
Refill denied; duplicate message; denied on 04/24/16.  Needs OV.  Has scheduled OV with me on 05/15/16.

## 2016-05-15 ENCOUNTER — Ambulatory Visit (INDEPENDENT_AMBULATORY_CARE_PROVIDER_SITE_OTHER): Payer: 59 | Admitting: Family Medicine

## 2016-05-15 ENCOUNTER — Encounter: Payer: Self-pay | Admitting: Family Medicine

## 2016-05-15 VITALS — BP 107/67 | HR 69 | Temp 98.3°F | Resp 16 | Ht 65.0 in | Wt 146.6 lb

## 2016-05-15 DIAGNOSIS — F329 Major depressive disorder, single episode, unspecified: Secondary | ICD-10-CM

## 2016-05-15 DIAGNOSIS — M1712 Unilateral primary osteoarthritis, left knee: Secondary | ICD-10-CM

## 2016-05-15 DIAGNOSIS — Z1231 Encounter for screening mammogram for malignant neoplasm of breast: Secondary | ICD-10-CM

## 2016-05-15 DIAGNOSIS — F419 Anxiety disorder, unspecified: Secondary | ICD-10-CM

## 2016-05-15 MED ORDER — ALPRAZOLAM 0.5 MG PO TABS: 0.5000 mg | ORAL_TABLET | Freq: Every evening | ORAL | 1 refills | Status: DC | PRN

## 2016-05-15 MED ORDER — ALPRAZOLAM 0.5 MG PO TABS: 0.5000 mg | ORAL_TABLET | Freq: Three times a day (TID) | ORAL | 1 refills | Status: DC | PRN

## 2016-05-15 MED ORDER — DULOXETINE HCL 30 MG PO CPEP: 30.0000 mg | ORAL_CAPSULE | Freq: Every day | ORAL | 1 refills | Status: DC

## 2016-05-15 MED FILL — DULoxetine HCL 30 MG CPEP: 30 | 90 days supply | Qty: 90 | Fill #0

## 2016-05-15 MED FILL — RESTASIS 0.05% EYE EMULSION: 0.05 | 90 days supply | Qty: 180 | Fill #0

## 2016-05-15 NOTE — Progress Notes (Signed)
Subjective:    Patient ID: Lacey [name], female    DOB: 04-04-1955, 61 y.o.   MRN: [id number]  05/15/2016  Medication Refill (Xanax and Cymbalta) and Immunizations (discussed with patient regarding Tdap/Tetanus; patient states she believes she had one already)   HPI This 61 y.o. female presents for evaluation of anxiety/depression.  Doing well on Cymbalta.  If gets anxious, will get really anxious.  Son is graduating from school.  Not sleeping.  Very upset at work.  Stand up for self at work.  Hard worker; well liked. Takes it at 5:00am.     There is no immunization history on file for this patient. BP Readings from Last 3 Encounters:  05/15/16 107/67  01/04/16 122/63  12/25/15 136/81   Wt Readings from Last 3 Encounters:  05/15/16 146 lb 9.6 oz (66.5 kg)  01/02/16 151 lb (68.5 kg)  12/25/15 151 lb 14.4 oz (68.9 kg)    Review of Systems  Constitutional: Negative for chills, diaphoresis, fatigue and fever.  Eyes: Negative for visual disturbance.  Respiratory: Negative for cough and shortness of breath.   Cardiovascular: Negative for chest pain, palpitations and leg swelling.  Gastrointestinal: Negative for abdominal pain, constipation, diarrhea, nausea and vomiting.  Endocrine: Negative for cold intolerance, heat intolerance, polydipsia, polyphagia and polyuria.  Neurological: Negative for dizziness, tremors, seizures, syncope, facial asymmetry, speech difficulty, weakness, light-headedness, numbness and headaches.  Psychiatric/Behavioral: Positive for dysphoric mood. Negative for self-injury, sleep disturbance and suicidal ideas. The patient is nervous/anxious.     Past Medical History:  Diagnosis Date  . Anxiety   . Arthritis   . GERD (gastroesophageal reflux disease)   . Rotator cuff tear    Past Surgical History:  Procedure Laterality Date  . ANTERIOR AND POSTERIOR REPAIR  2003   with the TAH  . BICEPT TENODESIS  11/05/2011   Procedure: BICEPS TENODESIS;   Surgeon: Hessie Dibble, MD;  Location: [place];  Service: Orthopedics;  Laterality: Right;   TENODESIS  AND  ACRMIOCLAVICULAR RESECTION   . ERCP  2012  . HARDWARE REMOVAL  2010   left ankle  . JOINT REPLACEMENT Right 2015  . KNEE ARTHROSCOPY     right and left  . ORIF ANKLE FRACTURE  2009   left  . SHOULDER ARTHROSCOPY WITH ROTATOR CUFF REPAIR AND OPEN BICEPS TENODESIS Left 11/18/2014   Procedure: SHOULDER ARTHROSCOPY WITH ROTATOR CUFF REPAIR AND OPEN BICEPS TENODESIS;  Surgeon: Melrose Nakayama, MD;  Location: [place];  Service: Orthopedics;  Laterality: Left;  . SHOULDER OPEN ROTATOR CUFF REPAIR  11/05/2011   Procedure: ROTATOR CUFF REPAIR SHOULDER OPEN;  Surgeon: Hessie Dibble, MD;  Location: [place];  Service: Orthopedics;  Laterality: Right;  RIGHT SHOULDER OPEN ROTATOR CUFF REPAIR  . TONSILLECTOMY    . TOTAL ABDOMINAL HYSTERECTOMY  2003   rt ovary,  . TOTAL KNEE ARTHROPLASTY Right 11/09/2013   Procedure: TOTAL KNEE ARTHROPLASTY;  Surgeon: Hessie Dibble, MD;  Location: [place];  Service: Orthopedics;  Laterality: Right;  . TOTAL KNEE ARTHROPLASTY Left 01/02/2016   Procedure: TOTAL KNEE ARTHROPLASTY;  Surgeon: Melrose Nakayama, MD;  Location: [place];  Service: Orthopedics;  Laterality: Left;  . UPPER GASTROINTESTINAL ENDOSCOPY     Allergies  Allergen Reactions  . Morphine And Related Other (See Comments)    "feels crazy"    Social History   Social History  . Marital status: Married    Spouse name: N/A  . Number  of children: N/A  . Years of education: N/A   Occupational History  . Not on file.   Social History Main Topics  . Smoking status: Never Smoker  . Smokeless tobacco: Never Used  . Alcohol use No     Comment: wine daily  . Drug use: No  . Sexual activity: Yes    Birth control/ protection: Surgical   Other Topics Concern  . Not on file   Social History Narrative  . No narrative on file   Family  History  Problem Relation Age of Onset  . Depression Mother   . Hypertension Mother   . Diabetes Father        Objective:    BP 107/67   Pulse 69   Temp 98.3 F (36.8 C) (Oral)   Resp 16   Ht 5\' 5"  (1.651 m)   Wt 146 lb 9.6 oz (66.5 kg)   SpO2 98%   BMI 24.40 kg/m  Physical Exam  Constitutional: She is oriented to person, place, and time. She appears well-developed and well-nourished. No distress.  HENT:  Head: Normocephalic and atraumatic.  Right Ear: External ear normal.  Left Ear: External ear normal.  Nose: Nose normal.  Mouth/Throat: Oropharynx is clear and moist.  Eyes: Conjunctivae and EOM are normal. Pupils are equal, round, and reactive to light.  Neck: Normal range of motion. Neck supple. Carotid bruit is not present. No thyromegaly present.  Cardiovascular: Normal rate, regular rhythm, normal heart sounds and intact distal pulses.  Exam reveals no gallop and no friction rub.   No murmur heard. Pulmonary/Chest: Effort normal and breath sounds normal. She has no wheezes. She has no rales.  Abdominal: Soft. Bowel sounds are normal. She exhibits no distension and no mass. There is no tenderness. There is no rebound and no guarding.  Lymphadenopathy:    She has no cervical adenopathy.  Neurological: She is alert and oriented to person, place, and time. No cranial nerve deficit.  Skin: Skin is warm and dry. No rash noted. She is not diaphoretic. No erythema. No pallor.  Psychiatric: She has a normal mood and affect. Her behavior is normal. Judgment and thought content normal.   Depression screen [facility] 2/9 05/15/2016 07/13/2015 02/23/2015  Decreased Interest 0 0 0  Down, Depressed, Hopeless 0 0 0  PHQ - 2 Score 0 0 0        Assessment & Plan:   1. Anxiety and depression   2. Primary osteoarthritis of left knee   3. Encounter for screening mammogram for breast cancer    -continue Cymbalta 30mg  daily; pt reluctant to increase dose of medication at this time.  -refill  of Xanax provided yet encourage sparing use of Xanax and plan to wean over next three months. -recommend regular exercise and vitamin D supplementation. -refer for mammogram; also recommend CPE at follow-up.   Orders Placed This Encounter  Procedures  . MM Digital Screening    Standing Status:   Future    Standing Expiration Date:   07/15/2017    Order Specific Question:   Reason for Exam (SYMPTOM  OR DIAGNOSIS REQUIRED)    Answer:   annual screening    Order Specific Question:   Preferred imaging location?    Answer:   [facility]   Meds ordered this encounter  Medications  . DULoxetine (CYMBALTA) 30 MG capsule    Sig: Take 1 capsule (30 mg total) by mouth daily.    Dispense:  90 capsule  Refill:  1  . DISCONTD: ALPRAZolam (XANAX) 0.5 MG tablet    Sig: Take 1 tablet (0.5 mg total) by mouth 3 (three) times daily as needed for anxiety.    Dispense:  30 tablet    Refill:  1  . ALPRAZolam (XANAX) 0.5 MG tablet    Sig: Take 1 tablet (0.5 mg total) by mouth at bedtime as needed for anxiety.    Dispense:  30 tablet    Refill:  1    Return in about 4 months (around 09/15/2016) for complete physical examiniation.   Kristi Elayne Guerin, M.D. Primary Care at [facility] [address] [place], [place]  [address] [phone] phone [phone] fax

## 2016-05-15 NOTE — Patient Instructions (Addendum)
Generalized Anxiety Disorder, Adult Generalized anxiety disorder (GAD) is a mental health disorder. People with this condition constantly worry about everyday events. Unlike normal anxiety, worry related to GAD is not triggered by a specific event. These worries also do not fade or get better with time. GAD interferes with life functions, including relationships, work, and school. GAD can vary from mild to severe. People with severe GAD can have intense waves of anxiety with physical symptoms (panic attacks). What are the causes? The exact cause of GAD is not known. What increases the risk? This condition is more likely to develop in:  Women.  People who have a family history of anxiety disorders.  People who are very shy.  People who experience very stressful life events, such as the death of a loved one.  People who have a very stressful family environment. What are the signs or symptoms? People with GAD often worry excessively about many things in their lives, such as their health and family. They may also be overly concerned about:  Doing well at work.  Being on time.  Natural disasters.  Friendships. Physical symptoms of GAD include:  Fatigue.  Muscle tension or having muscle twitches.  Trembling or feeling shaky.  Being easily startled.  Feeling like your heart is pounding or racing.  Feeling out of breath or like you cannot take a deep breath.  Having trouble falling asleep or staying asleep.  Sweating.  Nausea, diarrhea, or irritable bowel syndrome (IBS).  Headaches.  Trouble concentrating or remembering facts.  Restlessness.  Irritability. How is this diagnosed? Your health care provider can diagnose GAD based on your symptoms and medical history. You will also have a physical exam. The health care provider will ask specific questions about your symptoms, including how severe they are, when they started, and if they come and go. Your health care  provider may ask you about your use of alcohol or drugs, including prescription medicines. Your health care provider may refer you to a mental health specialist for further evaluation. Your health care provider will do a thorough examination and may perform additional tests to rule out other possible causes of your symptoms. To be diagnosed with GAD, a person must have anxiety that:  Is out of his or her control.  Affects several different aspects of his or her life, such as work and relationships.  Causes distress that makes him or her unable to take part in normal activities.  Includes at least three physical symptoms of GAD, such as restlessness, fatigue, trouble concentrating, irritability, muscle tension, or sleep problems. Before your health care provider can confirm a diagnosis of GAD, these symptoms must be present more days than they are not, and they must last for six months or longer. How is this treated? The following therapies are usually used to treat GAD:  Medicine. Antidepressant medicine is usually prescribed for long-term daily control. Antianxiety medicines may be added in severe cases, especially when panic attacks occur.  Talk therapy (psychotherapy). Certain types of talk therapy can be helpful in treating GAD by providing support, education, and guidance. Options include:  Cognitive behavioral therapy (CBT). People learn coping skills and techniques to ease their anxiety. They learn to identify unrealistic or negative thoughts and behaviors and to replace them with positive ones.  Acceptance and commitment therapy (ACT). This treatment teaches people how to be mindful as a way to cope with unwanted thoughts and feelings.  Biofeedback. This process trains you to manage your body's response (  physiological response) through breathing techniques and relaxation methods. You will work with a therapist while machines are used to monitor your physical symptoms.  Stress  management techniques. These include yoga, meditation, and exercise. A mental health specialist can help determine which treatment is best for you. Some people see improvement with one type of therapy. However, other people require a combination of therapies. Follow these instructions at home:  Take over-the-counter and prescription medicines only as told by your health care provider.  Try to maintain a normal routine.  Try to anticipate stressful situations and allow extra time to manage them.  Practice any stress management or self-calming techniques as taught by your health care provider.  Do not punish yourself for setbacks or for not making progress.  Try to recognize your accomplishments, even if they are small.  Keep all follow-up visits as told by your health care provider. This is important. Contact a health care provider if:  Your symptoms do not get better.  Your symptoms get worse.  You have signs of depression, such as:  A persistently sad, cranky, or irritable mood.  Loss of enjoyment in activities that used to bring you joy.  Change in weight or eating.  Changes in sleeping habits.  Avoiding friends or family members.  Loss of energy for normal tasks.  Feelings of guilt or worthlessness. Get help right away if:  You have serious thoughts about hurting yourself or others. If you ever feel like you may hurt yourself or others, or have thoughts about taking your own life, get help right away. You can go to your nearest emergency department or call:  Your local emergency services (911 in the [place]  A suicide crisis helpline, such as the [facility] at 906-256-7012. This is open 24 hours a day. Summary  Generalized anxiety disorder (GAD) is a mental health disorder that involves worry that is not triggered by a specific event.  People with GAD often worry excessively about many things in their lives, such as their health and  family.  GAD may cause physical symptoms such as restlessness, trouble concentrating, sleep problems, frequent sweating, nausea, diarrhea, headaches, and trembling or muscle twitching.  A mental health specialist can help determine which treatment is best for you. Some people see improvement with one type of therapy. However, other people require a combination of therapies. This information is not intended to replace advice given to you by your health care provider. Make sure you discuss any questions you have with your health care provider. Document Released: 04/27/2012 Document Revised: 11/21/2015 Document Reviewed: 11/21/2015 Elsevier Interactive Patient Education  2017 [employer].     IF you received an x-ray today, you will receive an invoice from [facility]. Please contact [facility] at [phone] with questions or concerns regarding your invoice.   IF you received labwork today, you will receive an invoice from [facility]. Please contact [facility] at [phone] with questions or concerns regarding your invoice.   Our billing staff will not be able to assist you with questions regarding bills from these companies.  You will be contacted with the lab results as soon as they are available. The fastest way to get your results is to activate your My Chart account. Instructions are located on the last page of this paperwork. If you have not heard from [place] regarding the results in 2 weeks, please contact this office.

## 2016-05-21 ENCOUNTER — Inpatient Hospital Stay (HOSPITAL_BASED_OUTPATIENT_CLINIC_OR_DEPARTMENT_OTHER): Admission: RE | Admit: 2016-05-21 | Payer: Self-pay | Source: Ambulatory Visit

## 2016-07-01 ENCOUNTER — Ambulatory Visit (HOSPITAL_BASED_OUTPATIENT_CLINIC_OR_DEPARTMENT_OTHER)
Admission: RE | Admit: 2016-07-01 | Discharge: 2016-07-01 | Disposition: A | Discharge status: Home / Self Care | Payer: 59 | Source: Ambulatory Visit | Attending: Family Medicine | Admitting: Family Medicine

## 2016-07-01 DIAGNOSIS — Z1231 Encounter for screening mammogram for malignant neoplasm of breast: Secondary | ICD-10-CM | POA: Insufficient documentation

## 2016-07-30 ENCOUNTER — Ambulatory Visit (HOSPITAL_COMMUNITY)
Admission: EM | Admit: 2016-07-30 | Discharge: 2016-07-30 | Disposition: A | Discharge status: Home / Self Care | Payer: 59 | Attending: Internal Medicine | Admitting: Internal Medicine

## 2016-07-30 ENCOUNTER — Ambulatory Visit (INDEPENDENT_AMBULATORY_CARE_PROVIDER_SITE_OTHER): Payer: 59

## 2016-07-30 ENCOUNTER — Encounter (HOSPITAL_COMMUNITY): Payer: Self-pay | Admitting: Emergency Medicine

## 2016-07-30 DIAGNOSIS — J209 Acute bronchitis, unspecified: Secondary | ICD-10-CM

## 2016-07-30 DIAGNOSIS — Z760 Encounter for issue of repeat prescription: Secondary | ICD-10-CM | POA: Diagnosis not present

## 2016-07-30 DIAGNOSIS — R05 Cough: Secondary | ICD-10-CM | POA: Diagnosis not present

## 2016-07-30 MED ORDER — LEVOFLOXACIN 750 MG PO TABS: 750.0000 mg | ORAL_TABLET | Freq: Every day | ORAL | 0 refills | Status: AC

## 2016-07-30 MED ORDER — BENZONATATE 100 MG PO CAPS: 100.0000 mg | ORAL_CAPSULE | Freq: Three times a day (TID) | ORAL | 0 refills | Status: DC

## 2016-07-30 MED ORDER — ALPRAZOLAM 0.5 MG PO TABS: 0.5000 mg | ORAL_TABLET | Freq: Every evening | ORAL | 0 refills | Status: DC | PRN

## 2016-07-30 MED ORDER — ALBUTEROL SULFATE HFA 108 (90 BASE) MCG/ACT IN AERS: 1.0000 | INHALATION_SPRAY | Freq: Four times a day (QID) | RESPIRATORY_TRACT | 1 refills | Status: DC | PRN

## 2016-07-30 MED FILL — BENZONATATE 100 MG CAP: 100 | 7 days supply | Qty: 21 | Fill #0

## 2016-07-30 MED FILL — ALPRAZolam 0.5 MG TABS: 0.5 | 10 days supply | Qty: 10 | Fill #0

## 2016-07-30 MED FILL — levoFLOXacin 750 MG TABS: 750 | 5 days supply | Qty: 5 | Fill #0

## 2016-07-30 MED FILL — VENTOLIN HFA 90 MCG INHALER: 108 (90 BAS | 25 days supply | Qty: 18 | Fill #0

## 2016-07-30 NOTE — ED Provider Notes (Signed)
CSN: [id number]     Arrival date & time 07/30/16  1012 History   None    Chief Complaint  Patient presents with  . URI   (Consider location/radiation/quality/duration/timing/severity/associated sxs/prior Treatment) Lacey [name] is a 61 y.o. female with a past history of anxiety, arthritis, GERD who presents to the [facility] urgent care with a chief complaint of cough 1 month along with sensation of fever, weakness, and fatigue. Denies history of smoking or environmental irritants    The history is provided by the patient.  Cough  Cough characteristics:  Non-productive, dry, hacking and hoarse Sputum characteristics:  Nondescript Severity:  Moderate Onset quality:  Gradual Duration:  4 weeks Timing:  Constant Progression:  Unchanged Chronicity:  New Smoker: no   Context: sick contacts   Context: not animal exposure, not exposure to allergens, not smoke exposure and not upper respiratory infection   Relieved by:  Nothing Worsened by:  Lying down Ineffective treatments:  Decongestant, cough suppressants and steam Associated symptoms: chills, fever, shortness of breath and wheezing   Associated symptoms: no chest pain, no rhinorrhea, no sinus congestion, no sore throat and no weight loss     Past Medical History:  Diagnosis Date  . Anxiety   . Arthritis   . GERD (gastroesophageal reflux disease)   . Rotator cuff tear    Past Surgical History:  Procedure Laterality Date  . ANTERIOR AND POSTERIOR REPAIR  2003   with the TAH  . BICEPT TENODESIS  11/05/2011   Procedure: BICEPS TENODESIS;  Surgeon: Hessie Dibble, MD;  Location: [place];  Service: Orthopedics;  Laterality: Right;   TENODESIS  AND  ACRMIOCLAVICULAR RESECTION   . ERCP  2012  . HARDWARE REMOVAL  2010   left ankle  . JOINT REPLACEMENT Right 2015  . KNEE ARTHROSCOPY     right and left  . ORIF ANKLE FRACTURE  2009   left  . SHOULDER ARTHROSCOPY WITH ROTATOR CUFF REPAIR AND OPEN BICEPS  TENODESIS Left 11/18/2014   Procedure: SHOULDER ARTHROSCOPY WITH ROTATOR CUFF REPAIR AND OPEN BICEPS TENODESIS;  Surgeon: Melrose Nakayama, MD;  Location: [place];  Service: Orthopedics;  Laterality: Left;  . SHOULDER OPEN ROTATOR CUFF REPAIR  11/05/2011   Procedure: ROTATOR CUFF REPAIR SHOULDER OPEN;  Surgeon: Hessie Dibble, MD;  Location: [place];  Service: Orthopedics;  Laterality: Right;  RIGHT SHOULDER OPEN ROTATOR CUFF REPAIR  . TONSILLECTOMY    . TOTAL ABDOMINAL HYSTERECTOMY  2003   rt ovary,  . TOTAL KNEE ARTHROPLASTY Right 11/09/2013   Procedure: TOTAL KNEE ARTHROPLASTY;  Surgeon: Hessie Dibble, MD;  Location: [place];  Service: Orthopedics;  Laterality: Right;  . TOTAL KNEE ARTHROPLASTY Left 01/02/2016   Procedure: TOTAL KNEE ARTHROPLASTY;  Surgeon: Melrose Nakayama, MD;  Location: [place];  Service: Orthopedics;  Laterality: Left;  . UPPER GASTROINTESTINAL ENDOSCOPY     Family History  Problem Relation Age of Onset  . Depression Mother   . Hypertension Mother   . Diabetes Father    Social History  Substance Use Topics  . Smoking status: Never Smoker  . Smokeless tobacco: Never Used  . Alcohol use No     Comment: wine daily   OB History    No data available     Review of Systems  Constitutional: Positive for chills and fever. Negative for weight loss.  HENT: Negative for congestion, rhinorrhea and sore throat.   Respiratory: Positive for cough,  shortness of breath and wheezing.   Cardiovascular: Negative for chest pain and palpitations.  Gastrointestinal: Negative for abdominal pain, constipation, diarrhea, nausea and vomiting.  Musculoskeletal: Negative.   Skin: Negative.   Neurological: Negative.     Allergies  Morphine and related  Home Medications   Prior to Admission medications   Medication Sig Start Date End Date Taking? Authorizing Provider  albuterol (PROVENTIL HFA;VENTOLIN HFA) 108 (90 Base) MCG/ACT inhaler Inhale  1-2 puffs into the lungs every 6 (six) hours as needed for wheezing or shortness of breath. 07/30/16   Barnet Glasgow, NP  ALPRAZolam Duanne Moron) 0.5 MG tablet Take 1 tablet (0.5 mg total) by mouth at bedtime as needed for anxiety. 05/15/16   Wardell Honour, MD  ALPRAZolam Duanne Moron) 0.5 MG tablet Take 1 tablet (0.5 mg total) by mouth at bedtime as needed for anxiety. 07/30/16   Barnet Glasgow, NP  B Complex-Folic Acid (B COMPLEX-VITAMIN B12 PO) Take 1 tablet by mouth daily.    [provider]  benzonatate (TESSALON) 100 MG capsule Take 1 capsule (100 mg total) by mouth every 8 (eight) hours. 07/30/16   Barnet Glasgow, NP  cycloSPORINE (RESTASIS) 0.05 % ophthalmic emulsion Place 1 drop into both eyes 2 (two) times daily.    [provider]  diphenhydrAMINE (BENADRYL) 25 MG tablet Take 50 mg by mouth at bedtime.    [provider]  DULoxetine (CYMBALTA) 30 MG capsule Take 1 capsule (30 mg total) by mouth daily. 05/15/16   Wardell Honour, MD  levofloxacin (LEVAQUIN) 750 MG tablet Take 1 tablet (750 mg total) by mouth daily. 07/30/16 08/04/16  Barnet Glasgow, NP   Meds Ordered and Administered this Visit  Medications - No data to display  BP 128/89 (BP Location: Left Arm)   Pulse 81   Temp 97.8 F (36.6 C) (Oral)   Resp 20   SpO2 98%  No data found.   Physical Exam  Constitutional: She is oriented to person, place, and time. She appears well-developed and well-nourished. No distress.  HENT:  Head: Normocephalic.  Right Ear: External ear normal.  Left Ear: External ear normal.  Mouth/Throat: Oropharynx is clear and moist.  Eyes: Conjunctivae are normal.  Neck: Normal range of motion.  Cardiovascular: Normal rate and regular rhythm.   Pulmonary/Chest: Effort normal. She has no decreased breath sounds. She has no wheezes. She has rhonchi in the right lower field and the left middle field.  Neurological: She is alert and oriented to person, place, and time.  Skin:  Skin is warm. Capillary refill takes less than 2 seconds. She is diaphoretic.  Psychiatric: She has a normal mood and affect. Her behavior is normal.  Nursing note and vitals reviewed.   Urgent Care Course     Procedures (including critical care time)  Labs Review Labs Reviewed - No data to display  Imaging Review Dg Chest 2 View  Result Date: 07/30/2016 CLINICAL DATA:  Cough 1 month EXAM: CHEST  2 VIEW COMPARISON:  12/25/2015 FINDINGS: COPD with pulmonary hyperinflation. Calcified granuloma in the lingula. This is unchanged. Negative for infiltrate effusion or heart failure. No change from the prior study. Mild compression fracture midthoracic spine unchanged IMPRESSION: COPD.  No acute cardiopulmonary abnormality. Electronically Signed   By: Franchot Gallo M.D.   On: 07/30/2016 10:43       MDM   1. Acute bronchitis, unspecified organism   2. Medication refill     Treating for Acute bronchitis. Starting on Levaquin, Tessalon, and  Albuterol. Provided counseling on the diagnosis, provided counseling on over-the-counter therapies for symptom management. Follow-up with primary care provider in 2-3 weeks for further evaluation  Medication refill given a short course of her Xanax, follow-up with her regular doctor for further evaluation.    Barnet Glasgow, NP 07/30/16 1059

## 2016-07-30 NOTE — ED Notes (Signed)
Discharged by Tildon Husky NP

## 2016-07-30 NOTE — ED Triage Notes (Signed)
Cough, seen by lawrence kennard, np prior to this nurse

## 2016-07-30 NOTE — Discharge Instructions (Addendum)
I am treating your for bronchitis. I have started you on Levaquin, take one tablet daily. For wheezing and shortness of breath, I started you on an albuterol inhaler, do 1-2 puffs every 4-6 hours as needed. To keep your secretions then I recommend Mucinex twice daily with a full glass of water. For cough, I have prescribed a medication called Tessalon. Take 1 tablet every 8 hours as needed for your cough. Follow up with your primary care provider in 2-3 weeks for reevaluation.

## 2016-08-05 ENCOUNTER — Encounter (HOSPITAL_COMMUNITY): Payer: Self-pay | Admitting: Emergency Medicine

## 2016-08-05 ENCOUNTER — Ambulatory Visit (HOSPITAL_COMMUNITY)
Admission: EM | Admit: 2016-08-05 | Discharge: 2016-08-05 | Disposition: A | Discharge status: Home / Self Care | Payer: 59 | Attending: Internal Medicine | Admitting: Internal Medicine

## 2016-08-05 DIAGNOSIS — R0602 Shortness of breath: Secondary | ICD-10-CM

## 2016-08-05 DIAGNOSIS — R05 Cough: Secondary | ICD-10-CM

## 2016-08-05 DIAGNOSIS — R062 Wheezing: Secondary | ICD-10-CM | POA: Diagnosis not present

## 2016-08-05 DIAGNOSIS — R059 Cough, unspecified: Secondary | ICD-10-CM

## 2016-08-05 DIAGNOSIS — J9801 Acute bronchospasm: Secondary | ICD-10-CM

## 2016-08-05 MED ORDER — ALBUTEROL SULFATE (2.5 MG/3ML) 0.083% IN NEBU
INHALATION_SOLUTION | RESPIRATORY_TRACT | Status: AC
Start: 2016-08-05 — End: 2016-08-05
  Filled 2016-08-05: qty 3

## 2016-08-05 MED ORDER — FLUTICASONE PROPIONATE HFA 110 MCG/ACT IN AERO: 1.0000 | INHALATION_SPRAY | Freq: Two times a day (BID) | RESPIRATORY_TRACT | 1 refills | Status: DC

## 2016-08-05 MED ORDER — PREDNISONE 10 MG (21) PO TBPK: ORAL_TABLET | ORAL | 0 refills | Status: DC

## 2016-08-05 MED ORDER — IPRATROPIUM-ALBUTEROL 0.5-2.5 (3) MG/3ML IN SOLN
3.0000 mL | Freq: Once | RESPIRATORY_TRACT | Status: AC
  Administered 2016-08-05: 3 mL via RESPIRATORY_TRACT

## 2016-08-05 MED ORDER — ALBUTEROL SULFATE (2.5 MG/3ML) 0.083% IN NEBU
2.5000 mg | INHALATION_SOLUTION | Freq: Once | RESPIRATORY_TRACT | Status: AC
  Administered 2016-08-05: 2.5 mg via RESPIRATORY_TRACT

## 2016-08-05 MED ORDER — IPRATROPIUM-ALBUTEROL 0.5-2.5 (3) MG/3ML IN SOLN
RESPIRATORY_TRACT | Status: AC
  Filled 2016-08-05: qty 3

## 2016-08-05 MED ORDER — TRIAMCINOLONE ACETONIDE 40 MG/ML IJ SUSP
40.0000 mg | Freq: Once | INTRAMUSCULAR | Status: AC
  Administered 2016-08-05: 40 mg via INTRAMUSCULAR

## 2016-08-05 MED FILL — FLOVENT HFA 110 MCG INHALER: 110 | 60 days supply | Qty: 12 | Fill #0

## 2016-08-05 MED FILL — predniSONE 10 MG TABS: 10 | 6 days supply | Qty: 21 | Fill #0

## 2016-08-05 NOTE — ED Triage Notes (Signed)
Pt here for persistent cold sx... Seen here on 07/30/16  Was given albuterol, xanax, benzonatate, and levofloxacin w/no relief.   Sx today include intermittent fevers, chills, non prod cough, nasal drainage, wheezing, SOB and dyspnea  Has not been able to work due to sx.   A&O x4... NAD... Ambulatory

## 2016-08-05 NOTE — ED Notes (Signed)
Pt left before being d/c... sts she feels better and to tell provider that if he is going to call in any meds to call it in to her pharm  Notified Youlanda Roys, NP.

## 2016-08-05 NOTE — ED Notes (Signed)
Peak flow: standing: 260, 290, 290

## 2016-08-05 NOTE — ED Provider Notes (Signed)
CSN: [id number]     Arrival date & time 08/05/16  7096 History   First MD Initiated Contact with Patient 08/05/16 1024     Chief Complaint  Patient presents with  . URI   (Consider location/radiation/quality/duration/timing/severity/associated sxs/prior Treatment) 61 year old female who works as a Estate manager/land agent at [employer] states that for 6 weeks she has had some upper respiratory symptoms and cough. She was seen in this facility for which ago and treated with albuterol HFA, Tessalon Perles, Levaquin and refill of Xanax. She states that her cough is persistent. She is using the albuterol inhaler 2 puffs about 3 times in an 8 hour shift. The cough is dry and nonproductive. And she feels short of breath. Reviewing the chart reveals the chest x-ray performed 4 weeks ago showed COPD changes but no infiltrates or sign of infection or pneumonia. No change and previous x-ray approximately 7 months earlier. She is not running a fever.  Pre-neb peak flow readings are 260 and 290. Expected peak number for age and height is 430.      Past Medical History:  Diagnosis Date  . Anxiety   . Arthritis   . GERD (gastroesophageal reflux disease)   . Rotator cuff tear    Past Surgical History:  Procedure Laterality Date  . ANTERIOR AND POSTERIOR REPAIR  2003   with the TAH  . BICEPT TENODESIS  11/05/2011   Procedure: BICEPS TENODESIS;  Surgeon: Hessie Dibble, MD;  Location: [place];  Service: Orthopedics;  Laterality: Right;   TENODESIS  AND  ACRMIOCLAVICULAR RESECTION   . ERCP  2012  . HARDWARE REMOVAL  2010   left ankle  . JOINT REPLACEMENT Right 2015  . KNEE ARTHROSCOPY     right and left  . ORIF ANKLE FRACTURE  2009   left  . SHOULDER ARTHROSCOPY WITH ROTATOR CUFF REPAIR AND OPEN BICEPS TENODESIS Left 11/18/2014   Procedure: SHOULDER ARTHROSCOPY WITH ROTATOR CUFF REPAIR AND OPEN BICEPS TENODESIS;  Surgeon: Melrose Nakayama, MD;  Location: [place];  Service:  Orthopedics;  Laterality: Left;  . SHOULDER OPEN ROTATOR CUFF REPAIR  11/05/2011   Procedure: ROTATOR CUFF REPAIR SHOULDER OPEN;  Surgeon: Hessie Dibble, MD;  Location: [place];  Service: Orthopedics;  Laterality: Right;  RIGHT SHOULDER OPEN ROTATOR CUFF REPAIR  . TONSILLECTOMY    . TOTAL ABDOMINAL HYSTERECTOMY  2003   rt ovary,  . TOTAL KNEE ARTHROPLASTY Right 11/09/2013   Procedure: TOTAL KNEE ARTHROPLASTY;  Surgeon: Hessie Dibble, MD;  Location: [place];  Service: Orthopedics;  Laterality: Right;  . TOTAL KNEE ARTHROPLASTY Left 01/02/2016   Procedure: TOTAL KNEE ARTHROPLASTY;  Surgeon: Melrose Nakayama, MD;  Location: [place];  Service: Orthopedics;  Laterality: Left;  . UPPER GASTROINTESTINAL ENDOSCOPY     Family History  Problem Relation Age of Onset  . Depression Mother   . Hypertension Mother   . Diabetes Father    Social History  Substance Use Topics  . Smoking status: Never Smoker  . Smokeless tobacco: Never Used  . Alcohol use No     Comment: wine daily   OB History    No data available     Review of Systems  Constitutional: Negative for activity change, appetite change, chills, fatigue and fever.  HENT: Negative for congestion, facial swelling, postnasal drip, rhinorrhea and sore throat.   Eyes: Negative.   Respiratory: Positive for cough, shortness of breath and wheezing.   Cardiovascular: Negative.  Gastrointestinal: Negative.   Musculoskeletal: Negative for neck pain and neck stiffness.  Skin: Negative for pallor and rash.  Neurological: Negative.   All other systems reviewed and are negative.   Allergies  Morphine and related  Home Medications   Prior to Admission medications   Medication Sig Start Date End Date Taking? Authorizing Provider  albuterol (PROVENTIL HFA;VENTOLIN HFA) 108 (90 Base) MCG/ACT inhaler Inhale 1-2 puffs into the lungs every 6 (six) hours as needed for wheezing or shortness of breath. 07/30/16  Yes Barnet Glasgow, NP  ALPRAZolam Duanne Moron) 0.5 MG tablet Take 1 tablet (0.5 mg total) by mouth at bedtime as needed for anxiety. 05/15/16  Yes Wardell Honour, MD  ALPRAZolam Duanne Moron) 0.5 MG tablet Take 1 tablet (0.5 mg total) by mouth at bedtime as needed for anxiety. 07/30/16  Yes Barnet Glasgow, NP  B Complex-Folic Acid (B COMPLEX-VITAMIN B12 PO) Take 1 tablet by mouth daily.   Yes [provider]  benzonatate (TESSALON) 100 MG capsule Take 1 capsule (100 mg total) by mouth every 8 (eight) hours. 07/30/16  Yes Barnet Glasgow, NP  cycloSPORINE (RESTASIS) 0.05 % ophthalmic emulsion Place 1 drop into both eyes 2 (two) times daily.   Yes [provider]  diphenhydrAMINE (BENADRYL) 25 MG tablet Take 50 mg by mouth at bedtime.    [provider]  DULoxetine (CYMBALTA) 30 MG capsule Take 1 capsule (30 mg total) by mouth daily. 05/15/16   Wardell Honour, MD  fluticasone (FLOVENT HFA) 110 MCG/ACT inhaler Inhale 1 puff into the lungs 2 (two) times daily. 08/05/16   Janne Napoleon, NP  predniSONE (STERAPRED UNI-PAK 21 TAB) 10 MG (21) TBPK tablet Dispense one 6 day pack. Take as directed with food. 08/05/16   Janne Napoleon, NP   Meds Ordered and Administered this Visit   Medications  ipratropium-albuterol (DUONEB) 0.5-2.5 (3) MG/3ML nebulizer solution 3 mL (3 mLs Nebulization Given 08/05/16 1056)  triamcinolone acetonide (KENALOG-40) injection 40 mg (40 mg Intramuscular Given 08/05/16 1055)  albuterol (PROVENTIL) (2.5 MG/3ML) 0.083% nebulizer solution 2.5 mg (2.5 mg Nebulization Given 08/05/16 1144)    BP 132/83 (BP Location: Right Arm)   Pulse 76   Temp 98.2 F (36.8 C) (Oral)   Resp 20   SpO2 96%  No data found.   Physical Exam  Constitutional: She is oriented to person, place, and time. She appears well-developed and well-nourished. No distress.  Eyes: EOM are normal.  Neck: Normal range of motion. Neck supple.  Cardiovascular: Normal rate, regular rhythm, normal heart sounds and  intact distal pulses.   Pulmonary/Chest: Effort normal. No respiratory distress. She has wheezes. She has no rales.  Bilateral diffuse coarseness. Prolonged expiratory phase.  Musculoskeletal: Normal range of motion. She exhibits no edema.  Neurological: She is alert and oriented to person, place, and time.  Skin: Skin is warm and dry. No rash noted.  Psychiatric: She has a normal mood and affect.  Nursing note and vitals reviewed.   Urgent Care Course     Procedures (including critical care time)  Labs Review Labs Reviewed - No data to display  Imaging Review No results found.   Visual Acuity Review  Right Eye Distance:   Left Eye Distance:   Bilateral Distance:    Right Eye Near:   Left Eye Near:    Bilateral Near:         MDM   1. Cough   2. Bronchospasm     Post DuoNeb the patient has  improved air movement but continues to have bilateral coarseness and now louder wheezes. She states she feels a little lighter with her breathing. Her overall improvement is mild to moderate and will repeat a plain albuterol neb 15 minutes later. After the second albuterol nebulizer the patient states she was breathing even better. She got a call from work that she needed to come back as soon as possible. The patient told Shonna Chock, [place] that she was breathing better but had to leave to go to work. She was satisfied with the treatment she received. We had already discussed the care after she received the last breathing treatment including medications, follow up with PCP as well as possible referral to pulmonology to obtain PFT s. Patient states she understood. It is uncertain as to exactly what is causing the persistent cough and bronchospasm. Allergies may be playing a role. It does not appear to be infection. If you are getting substantially better this week just keep your appointment with your PCP. If you are not getting much better, continued to wheeze then call your PCP to see earlier and  consider referral to pulmonary and have pulmonary function testing. Take medication as directed. When taking the prednisone take with food. Continue using the albuterol inhaler 2 puffs every 4 hours as needed. Hopefully the steroids will decrease the requirements/frequency of this use. For worsening, new symptoms or problems may return. Meds ordered this encounter  Medications  . ipratropium-albuterol (DUONEB) 0.5-2.5 (3) MG/3ML nebulizer solution 3 mL  . triamcinolone acetonide (KENALOG-40) injection 40 mg  . albuterol (PROVENTIL) (2.5 MG/3ML) 0.083% nebulizer solution 2.5 mg  . fluticasone (FLOVENT HFA) 110 MCG/ACT inhaler    Sig: Inhale 1 puff into the lungs 2 (two) times daily.    Dispense:  1 Inhaler    Refill:  1    Order Specific Question:   Supervising Provider    Answer:   Jacklynn Ganong [phone]  . predniSONE (STERAPRED UNI-PAK 21 TAB) 10 MG (21) TBPK tablet    Sig: Dispense one 6 day pack. Take as directed with food.    Dispense:  21 tablet    Refill:  0    Order Specific Question:   Supervising Provider    Answer:   Jacklynn Ganong [[id number]]       Janne Napoleon, NP 08/05/16 1221

## 2016-08-05 NOTE — Discharge Instructions (Signed)
It is uncertain as to exactly what is causing the persistent cough and bronchospasm. Allergies may be playing a role. It does not appear to be infection. If you are getting substantially better this week just keep your appointment with your PCP. If you are not getting much better, continued to wheeze then call your PCP to see earlier and consider referral to pulmonary and have pulmonary function testing. Take medication as directed. When taking the prednisone take with food. Continue using the albuterol inhaler 2 puffs every 4 hours as needed. Hopefully the steroids will decrease the requirements/frequency of this use. For worsening, new symptoms or problems may return.

## 2016-08-05 NOTE — ED Notes (Signed)
Post-Peakflow (standing) - 240, 310, 310

## 2016-09-06 ENCOUNTER — Other Ambulatory Visit: Payer: Self-pay | Admitting: Family Medicine

## 2016-09-09 MED FILL — DULoxetine HCL 20 MG CPEP: 20 | 30 days supply | Qty: 30 | Fill #0

## 2016-09-16 NOTE — Progress Notes (Signed)
Subjective:    Patient ID: Lacey [name], female    DOB: 03-24-1955, 61 y.o.   MRN: [id number]  09/17/2016  Annual Exam   HPI This 61 y.o. female presents for Complete Physical Examination.  Last physical: Pap smear: hysterectomy Mammogram:  2018 Colonoscopy: 2014; [place]. Eye exam: Dental exam:  BP Readings from Last 3 Encounters:  09/17/16 113/72  08/05/16 132/83  07/30/16 128/89   Wt Readings from Last 3 Encounters:  09/17/16 148 lb (67.1 kg)  05/15/16 146 lb 9.6 oz (66.5 kg)  01/02/16 151 lb (68.5 kg)   Anxiety/depression: gained weight on Zoloft.  Previously took Prozac.  Previous took Effexor. Took Wellbutrin but got angry.     There is no immunization history on file for this patient.  Review of Systems  Constitutional: Negative for activity change, appetite change, chills, diaphoresis, fatigue, fever and unexpected weight change.  HENT: Negative for congestion, dental problem, drooling, ear discharge, ear pain, facial swelling, hearing loss, mouth sores, nosebleeds, postnasal drip, rhinorrhea, sinus pressure, sneezing, sore throat, tinnitus, trouble swallowing and voice change.   Eyes: Negative for photophobia, pain, discharge, redness, itching and visual disturbance.  Respiratory: Negative for apnea, cough, choking, chest tightness, shortness of breath, wheezing and stridor.   Cardiovascular: Negative for chest pain, palpitations and leg swelling.  Gastrointestinal: Negative for abdominal distention, abdominal pain, anal bleeding, blood in stool, constipation, diarrhea, nausea, rectal pain and vomiting.  Endocrine: Negative for cold intolerance, heat intolerance, polydipsia, polyphagia and polyuria.  Genitourinary: Negative for decreased urine volume, difficulty urinating, dyspareunia, dysuria, enuresis, flank pain, frequency, genital sores, hematuria, menstrual problem, pelvic pain, urgency, vaginal bleeding, vaginal discharge and vaginal pain.  Musculoskeletal:  Negative for arthralgias, back pain, gait problem, joint swelling, myalgias, neck pain and neck stiffness.  Skin: Negative for color change, pallor, rash and wound.  Allergic/Immunologic: Negative for environmental allergies, food allergies and immunocompromised state.  Neurological: Negative for dizziness, tremors, seizures, syncope, facial asymmetry, speech difficulty, weakness, light-headedness, numbness and headaches.  Hematological: Negative for adenopathy. Does not bruise/bleed easily.  Psychiatric/Behavioral: Positive for dysphoric mood. Negative for agitation, behavioral problems, confusion, decreased concentration, hallucinations, self-injury, sleep disturbance and suicidal ideas. The patient is nervous/anxious. The patient is not hyperactive.     Past Medical History:  Diagnosis Date  . Anxiety   . Arthritis   . GERD (gastroesophageal reflux disease)   . Rotator cuff tear    Past Surgical History:  Procedure Laterality Date  . ANTERIOR AND POSTERIOR REPAIR  2003   with the TAH  . BICEPT TENODESIS  11/05/2011   Procedure: BICEPS TENODESIS;  Surgeon: Hessie Dibble, MD;  Location: [place];  Service: Orthopedics;  Laterality: Right;   TENODESIS  AND  ACRMIOCLAVICULAR RESECTION   . ERCP  2012  . HARDWARE REMOVAL  2010   left ankle  . JOINT REPLACEMENT Right 2015  . KNEE ARTHROSCOPY     right and left  . ORIF ANKLE FRACTURE  2009   left  . SHOULDER ARTHROSCOPY WITH ROTATOR CUFF REPAIR AND OPEN BICEPS TENODESIS Left 11/18/2014   Procedure: SHOULDER ARTHROSCOPY WITH ROTATOR CUFF REPAIR AND OPEN BICEPS TENODESIS;  Surgeon: Melrose Nakayama, MD;  Location: [place];  Service: Orthopedics;  Laterality: Left;  . SHOULDER OPEN ROTATOR CUFF REPAIR  11/05/2011   Procedure: ROTATOR CUFF REPAIR SHOULDER OPEN;  Surgeon: Hessie Dibble, MD;  Location: [place];  Service: Orthopedics;  Laterality: Right;  RIGHT SHOULDER OPEN ROTATOR CUFF  REPAIR  . TONSILLECTOMY    . TOTAL ABDOMINAL HYSTERECTOMY  2003   rt ovarian mass.  LEFT ovary remaining.  Marland Kitchen TOTAL KNEE ARTHROPLASTY Right 11/09/2013   Procedure: TOTAL KNEE ARTHROPLASTY;  Surgeon: Hessie Dibble, MD;  Location: [place];  Service: Orthopedics;  Laterality: Right;  . TOTAL KNEE ARTHROPLASTY Left 01/02/2016   Procedure: TOTAL KNEE ARTHROPLASTY;  Surgeon: Melrose Nakayama, MD;  Location: [place];  Service: Orthopedics;  Laterality: Left;  . UPPER GASTROINTESTINAL ENDOSCOPY     Allergies  Allergen Reactions  . Morphine And Related Other (See Comments)    "feels crazy"    Social History   Social History  . Marital status: Married    Spouse name: N/A  . Number of children: N/A  . Years of education: N/A   Occupational History  . Not on file.   Social History Main Topics  . Smoking status: Never Smoker  . Smokeless tobacco: Never Used  . Alcohol use No     Comment: wine daily  . Drug use: No  . Sexual activity: Yes    Birth control/ protection: Surgical   Other Topics Concern  . Not on file   Social History Narrative   Marital status: married      Children: 2 children; no grandchildren      Employment: Therapist, music for [employer]      Lives: with husband      Tobacco: none      Alcohol: none      Exercise:          Family History  Problem Relation Age of Onset  . Depression Mother   . Hypertension Mother   . Diabetes Father        Objective:    BP 113/72   Pulse 73   Temp 98 F (36.7 C) (Oral)   Resp 16   Ht 5' 3.39" (1.61 m)   Wt 148 lb (67.1 kg)   SpO2 98%   BMI 25.90 kg/m  Physical Exam  Constitutional: She is oriented to person, place, and time. She appears well-developed and well-nourished. No distress.  HENT:  Head: Normocephalic and atraumatic.  Right Ear: External ear normal.  Left Ear: External ear normal.  Nose: Nose normal.  Mouth/Throat: Oropharynx is clear and moist.  Eyes: Pupils are equal, round, and reactive to  light. Conjunctivae and EOM are normal.  Neck: Normal range of motion and full passive range of motion without pain. Neck supple. No JVD present. Carotid bruit is not present. No thyromegaly present.  Cardiovascular: Normal rate, regular rhythm, normal heart sounds and intact distal pulses.  Exam reveals no gallop and no friction rub.   No murmur heard. Pulmonary/Chest: Effort normal and breath sounds normal. She has no wheezes. She has no rales. Right breast exhibits no inverted nipple, no mass, no nipple discharge, no skin change and no tenderness. Left breast exhibits no inverted nipple, no mass, no nipple discharge, no skin change and no tenderness. Breasts are symmetrical.  Abdominal: Soft. Bowel sounds are normal. She exhibits no distension and no mass. There is no tenderness. There is no rebound and no guarding.  Genitourinary: Vagina normal. There is no rash, tenderness, lesion or injury on the right labia. There is no rash, tenderness, lesion or injury on the left labia. Right adnexum displays no mass, no tenderness and no fullness. Left adnexum displays no mass, no tenderness and no fullness.  Musculoskeletal:       Right shoulder:  Normal.       Left shoulder: Normal.       Cervical back: Normal.  Lymphadenopathy:    She has no cervical adenopathy.  Neurological: She is alert and oriented to person, place, and time. She has normal reflexes. No cranial nerve deficit. She exhibits normal muscle tone. Coordination normal.  Skin: Skin is warm and dry. No rash noted. She is not diaphoretic. No erythema. No pallor.  Psychiatric: She has a normal mood and affect. Her behavior is normal. Judgment and thought content normal.  Nursing note and vitals reviewed.   No results found. Depression screen [facility] 2/9 05/15/2016 07/13/2015 02/23/2015  Decreased Interest 0 0 0  Down, Depressed, Hopeless 0 0 0  PHQ - 2 Score 0 0 0   Fall Risk  05/15/2016 07/13/2015  Falls in the past year? No No          Assessment & Plan:   1. Routine physical examination   2. Screening for diabetes mellitus   3. Screening, lipid   4. Need for hepatitis C screening test   5. Screening for HIV (human immunodeficiency virus)   6. Anxiety and depression   7. Vitamin D deficiency   8. Cervical cancer screening    -anticipatory guidance provided --- exercise, weight loss, safe driving practices, aspirin 81mg  daily. -obtain age appropriate screening labs and labs for chronic disease management. -anxiety/depression uncontrolled; add Lexapro 5mg  daily to Cymbalta 20mg  daily; use Xanax PRN.  Counseling provided during visit.   Orders Placed This Encounter  Procedures  . Urinalysis, Routine w reflex microscopic   Meds ordered this encounter  Medications  . Zoster Vac Recomb Adjuvanted [facility]) injection    Sig: Inject 0.5 mLs into the muscle once.    Dispense:  0.5 mL    Refill:  1  . DULoxetine (CYMBALTA) 20 MG capsule    Sig: Take 1 capsule (20 mg total) by mouth daily.    Dispense:  90 capsule    Refill:  1    pt dose was increased wants to decrease back to 20mg   . escitalopram (LEXAPRO) 5 MG tablet    Sig: Take 1 tablet (5 mg total) by mouth at bedtime.    Dispense:  90 tablet    Refill:  1  . ALPRAZolam (XANAX) 0.5 MG tablet    Sig: Take 1 tablet (0.5 mg total) by mouth at bedtime as needed for anxiety.    Dispense:  30 tablet    Refill:  0    Return in about 3 months (around 12/17/2016) for recheck anxiety/irritability.   Bassel Gaskill Elayne Guerin, M.D. Primary [facility] at [facility] previously [facility] [address] [place], [place]  [address] [phone] phone [phone] fax

## 2016-09-17 ENCOUNTER — Encounter: Payer: Self-pay | Admitting: Family Medicine

## 2016-09-17 ENCOUNTER — Ambulatory Visit (INDEPENDENT_AMBULATORY_CARE_PROVIDER_SITE_OTHER): Payer: 59 | Admitting: Family Medicine

## 2016-09-17 VITALS — BP 113/72 | HR 73 | Temp 98.0°F | Resp 16 | Ht 63.39 in | Wt 148.0 lb

## 2016-09-17 DIAGNOSIS — F419 Anxiety disorder, unspecified: Secondary | ICD-10-CM | POA: Diagnosis not present

## 2016-09-17 DIAGNOSIS — E559 Vitamin D deficiency, unspecified: Secondary | ICD-10-CM | POA: Diagnosis not present

## 2016-09-17 DIAGNOSIS — F329 Major depressive disorder, single episode, unspecified: Secondary | ICD-10-CM | POA: Diagnosis not present

## 2016-09-17 DIAGNOSIS — Z13220 Encounter for screening for lipoid disorders: Secondary | ICD-10-CM

## 2016-09-17 DIAGNOSIS — Z1159 Encounter for screening for other viral diseases: Secondary | ICD-10-CM | POA: Diagnosis not present

## 2016-09-17 DIAGNOSIS — Z124 Encounter for screening for malignant neoplasm of cervix: Secondary | ICD-10-CM | POA: Diagnosis not present

## 2016-09-17 DIAGNOSIS — Z131 Encounter for screening for diabetes mellitus: Secondary | ICD-10-CM

## 2016-09-17 DIAGNOSIS — Z114 Encounter for screening for human immunodeficiency virus [HIV]: Secondary | ICD-10-CM | POA: Diagnosis not present

## 2016-09-17 DIAGNOSIS — Z0000 Encounter for general adult medical examination without abnormal findings: Secondary | ICD-10-CM | POA: Diagnosis not present

## 2016-09-17 MED ORDER — ESCITALOPRAM OXALATE 5 MG PO TABS: 5.0000 mg | ORAL_TABLET | Freq: Every day | ORAL | 1 refills | Status: DC

## 2016-09-17 MED ORDER — DULOXETINE HCL 20 MG PO CPEP: 20.0000 mg | ORAL_CAPSULE | Freq: Every day | ORAL | 1 refills | Status: DC

## 2016-09-17 MED ORDER — ALPRAZOLAM 0.5 MG PO TABS: 0.5000 mg | ORAL_TABLET | Freq: Every evening | ORAL | 0 refills | Status: DC | PRN

## 2016-09-17 MED ORDER — ZOSTER VAC RECOMB ADJUVANTED 50 MCG/0.5ML IM SUSR: 0.5000 mL | Freq: Once | INTRAMUSCULAR | 1 refills | Status: AC

## 2016-09-17 MED FILL — ESCITALOPRAM 5 MG TABLET: 5 | 90 days supply | Qty: 90 | Fill #0

## 2016-09-17 MED FILL — ALPRAZolam 0.5 MG TABS: 0.5 | 30 days supply | Qty: 30 | Fill #0

## 2016-09-17 NOTE — Progress Notes (Signed)
   Subjective:    Patient ID: Lacey [name], female    DOB: 01/30/1955, 61 y.o.   MRN: [id number]  HPI    Review of Systems  Neurological: Positive for dizziness, light-headedness and headaches.       Objective:   Physical Exam        Assessment & Plan:

## 2016-09-17 NOTE — Patient Instructions (Addendum)
IF you received an x-ray today, you will receive an invoice from [facility]. Please contact [facility] at [phone] with questions or concerns regarding your invoice.   IF you received labwork today, you will receive an invoice from [facility]. Please contact [facility] at [phone] with questions or concerns regarding your invoice.   Our billing staff will not be able to assist you with questions regarding bills from these companies.  You will be contacted with the lab results as soon as they are available. The fastest way to get your results is to activate your My Chart account. Instructions are located on the last page of this paperwork. If you have not heard from [place] regarding the results in 2 weeks, please contact this office.      Preventive Care 40-64 Years, Female Preventive care refers to lifestyle choices and visits with your health care provider that can promote health and wellness. What does preventive care include?  A yearly physical exam. This is also called an annual well check.  Dental exams once or twice a year.  Routine eye exams. Ask your health care provider how often you should have your eyes checked.  Personal lifestyle choices, including: ? Daily care of your teeth and gums. ? Regular physical activity. ? Eating a healthy diet. ? Avoiding tobacco and drug use. ? Limiting alcohol use. ? Practicing safe sex. ? Taking low-dose aspirin daily starting at age 34. ? Taking vitamin and mineral supplements as recommended by your health care provider. What happens during an annual well check? The services and screenings done by your health care provider during your annual well check will depend on your age, overall health, lifestyle risk factors, and family history of disease. Counseling Your health care provider may ask you questions about your:  Alcohol use.  Tobacco use.  Drug use.  Emotional well-being.  Home and relationship  well-being.  Sexual activity.  Eating habits.  Work and work Statistician.  Method of birth control.  Menstrual cycle.  Pregnancy history.  Screening You may have the following tests or measurements:  Height, weight, and BMI.  Blood pressure.  Lipid and cholesterol levels. These may be checked every 5 years, or more frequently if you are over 65 years old.  Skin check.  Lung cancer screening. You may have this screening every year starting at age 102 if you have a 30-pack-year history of smoking and currently smoke or have quit within the past 15 years.  Fecal occult blood test (FOBT) of the stool. You may have this test every year starting at age 33.  Flexible sigmoidoscopy or colonoscopy. You may have a sigmoidoscopy every 5 years or a colonoscopy every 10 years starting at age 23.  Hepatitis C blood test.  Hepatitis B blood test.  Sexually transmitted disease (STD) testing.  Diabetes screening. This is done by checking your blood sugar (glucose) after you have not eaten for a while (fasting). You may have this done every 1-3 years.  Mammogram. This may be done every 1-2 years. Talk to your health care provider about when you should start having regular mammograms. This may depend on whether you have a family history of breast cancer.  BRCA-related cancer screening. This may be done if you have a family history of breast, ovarian, tubal, or peritoneal cancers.  Pelvic exam and Pap test. This may be done every 3 years starting at age 3. Starting at age 55, this may be done every 5 years if  you have a Pap test in combination with an HPV test.  Bone density scan. This is done to screen for osteoporosis. You may have this scan if you are at high risk for osteoporosis.  Discuss your test results, treatment options, and if necessary, the need for more tests with your health care provider. Vaccines Your health care provider may recommend certain vaccines, such  as:  Influenza vaccine. This is recommended every year.  Tetanus, diphtheria, and acellular pertussis (Tdap, Td) vaccine. You may need a Td booster every 10 years.  Varicella vaccine. You may need this if you have not been vaccinated.  Zoster vaccine. You may need this after age 70.  Measles, mumps, and rubella (MMR) vaccine. You may need at least one dose of MMR if you were born in 1957 or later. You may also need a second dose.  Pneumococcal 13-valent conjugate (PCV13) vaccine. You may need this if you have certain conditions and were not previously vaccinated.  Pneumococcal polysaccharide (PPSV23) vaccine. You may need one or two doses if you smoke cigarettes or if you have certain conditions.  Meningococcal vaccine. You may need this if you have certain conditions.  Hepatitis A vaccine. You may need this if you have certain conditions or if you travel or work in places where you may be exposed to hepatitis A.  Hepatitis B vaccine. You may need this if you have certain conditions or if you travel or work in places where you may be exposed to hepatitis B.  Haemophilus influenzae type b (Hib) vaccine. You may need this if you have certain conditions.  Talk to your health care provider about which screenings and vaccines you need and how often you need them. This information is not intended to replace advice given to you by your health care provider. Make sure you discuss any questions you have with your health care provider. Document Released: 01/27/2015 Document Revised: 09/20/2015 Document Reviewed: 11/01/2014 Elsevier Interactive Patient Education  2017 [employer].

## 2016-09-18 ENCOUNTER — Ambulatory Visit: Payer: 59 | Admitting: Urgent Care

## 2016-09-18 ENCOUNTER — Other Ambulatory Visit: Payer: Self-pay | Admitting: Family Medicine

## 2016-09-18 DIAGNOSIS — Z1159 Encounter for screening for other viral diseases: Secondary | ICD-10-CM | POA: Diagnosis not present

## 2016-09-18 DIAGNOSIS — Z13220 Encounter for screening for lipoid disorders: Secondary | ICD-10-CM | POA: Diagnosis not present

## 2016-09-18 DIAGNOSIS — Z131 Encounter for screening for diabetes mellitus: Secondary | ICD-10-CM

## 2016-09-18 DIAGNOSIS — Z114 Encounter for screening for human immunodeficiency virus [HIV]: Secondary | ICD-10-CM

## 2016-09-18 DIAGNOSIS — F4323 Adjustment disorder with mixed anxiety and depressed mood: Secondary | ICD-10-CM | POA: Diagnosis not present

## 2016-09-18 LAB — PAP IG W/ RFLX HPV ASCU: PAP SMEAR COMMENT: 0

## 2016-09-19 LAB — CBC WITH DIFFERENTIAL/PLATELET
Basophils Absolute: 0 10*3/uL (ref 0.0–0.2)
Basos: 1 %
EOS (ABSOLUTE): 0.1 10*3/uL (ref 0.0–0.4)
EOS: 2 %
HEMOGLOBIN: 12.7 g/dL (ref 11.1–15.9)
Hematocrit: 37.9 % (ref 34.0–46.6)
IMMATURE GRANS (ABS): 0 10*3/uL (ref 0.0–0.1)
Immature Granulocytes: 0 %
LYMPHS ABS: 1.1 10*3/uL (ref 0.7–3.1)
Lymphs: 25 %
MCH: 29.6 pg (ref 26.6–33.0)
MCHC: 33.5 g/dL (ref 31.5–35.7)
MCV: 88 fL (ref 79–97)
MONOCYTES: 8 %
Monocytes Absolute: 0.3 10*3/uL (ref 0.1–0.9)
NEUTROS ABS: 2.8 10*3/uL (ref 1.4–7.0)
Neutrophils: 64 %
Platelets: 311 10*3/uL (ref 150–379)
RBC: 4.29 x10E6/uL (ref 3.77–5.28)
RDW: 14.2 % (ref 12.3–15.4)
WBC: 4.3 10*3/uL (ref 3.4–10.8)

## 2016-09-19 LAB — COMPREHENSIVE METABOLIC PANEL
ALK PHOS: 111 IU/L (ref 39–117)
ALT: 17 IU/L (ref 0–32)
AST: 21 IU/L (ref 0–40)
Albumin/Globulin Ratio: 2.2 (ref 1.2–2.2)
Albumin: 4.7 g/dL (ref 3.6–4.8)
BUN/Creatinine Ratio: 14 (ref 12–28)
BUN: 12 mg/dL (ref 8–27)
Bilirubin Total: 0.4 mg/dL (ref 0.0–1.2)
CO2: 22 mmol/L (ref 20–29)
CREATININE: 0.87 mg/dL (ref 0.57–1.00)
Calcium: 9.4 mg/dL (ref 8.7–10.3)
Chloride: 99 mmol/L (ref 96–106)
GFR calc non Af Amer: 72 mL/min/{1.73_m2} (ref >59)
GFR, EST AFRICAN AMERICAN: 83 mL/min/{1.73_m2} (ref >59)
GLOBULIN, TOTAL: 2.1 g/dL (ref 1.5–4.5)
Glucose: 89 mg/dL (ref 65–99)
Potassium: 4.3 mmol/L (ref 3.5–5.2)
SODIUM: 142 mmol/L (ref 134–144)
TOTAL PROTEIN: 6.8 g/dL (ref 6.0–8.5)

## 2016-09-19 LAB — LIPID PANEL
Chol/HDL Ratio: 3.3 ratio (ref 0.0–4.4)
Cholesterol, Total: 245 mg/dL — ABNORMAL HIGH (ref 100–199)
HDL: 74 mg/dL (ref >39)
LDL Calculated: 156 mg/dL — ABNORMAL HIGH (ref 0–99)
TRIGLYCERIDES: 76 mg/dL (ref 0–149)
VLDL Cholesterol Cal: 15 mg/dL (ref 5–40)

## 2016-09-19 LAB — URINALYSIS, ROUTINE W REFLEX MICROSCOPIC
BILIRUBIN UA: NEGATIVE
GLUCOSE, UA: NEGATIVE
KETONES UA: NEGATIVE
Leukocytes, UA: NEGATIVE
NITRITE UA: NEGATIVE
PROTEIN UA: NEGATIVE
RBC UA: NEGATIVE
SPEC GRAV UA: 1.018 (ref 1.005–1.030)
UUROB: 0.2 mg/dL (ref 0.2–1.0)
pH, UA: 5.5 (ref 5.0–7.5)

## 2016-09-19 LAB — TSH: TSH: 1.85 u[IU]/mL (ref 0.450–4.500)

## 2016-09-19 LAB — VITAMIN D 25 HYDROXY (VIT D DEFICIENCY, FRACTURES): Vit D, 25-Hydroxy: 34.1 ng/mL (ref 30.0–100.0)

## 2016-09-19 LAB — HEMOGLOBIN A1C
Est. average glucose Bld gHb Est-mCnc: 117 mg/dL
HEMOGLOBIN A1C: 5.7 % — AB (ref 4.8–5.6)

## 2016-09-19 LAB — VITAMIN B12: Vitamin B-12: 718 pg/mL (ref 232–1245)

## 2016-09-19 LAB — HIV ANTIBODY (ROUTINE TESTING W REFLEX): HIV SCREEN 4TH GENERATION: NONREACTIVE

## 2016-09-19 LAB — HEPATITIS C ANTIBODY

## 2016-09-26 ENCOUNTER — Telehealth: Payer: Self-pay | Admitting: Family Medicine

## 2016-09-30 MED FILL — FLOVENT HFA 110 MCG INHALER: 110 | 60 days supply | Qty: 12 | Fill #1

## 2016-11-19 MED FILL — DULoxetine HCL 20 MG CPEP: 20 | 90 days supply | Qty: 90 | Fill #0

## 2016-12-30 ENCOUNTER — Ambulatory Visit: Payer: 59 | Admitting: Family Medicine

## 2017-04-17 MED FILL — DULoxetine HCL 20 MG CPEP: 20 | 90 days supply | Qty: 90 | Fill #1

## 2017-05-08 MED FILL — RESTASIS 0.05% EYE EMULSION: 0.05 | 90 days supply | Qty: 180 | Fill #1

## 2017-06-10 ENCOUNTER — Encounter: Payer: Self-pay | Admitting: Family Medicine

## 2017-07-15 ENCOUNTER — Ambulatory Visit: Payer: 59 | Admitting: Family Medicine

## 2017-07-15 ENCOUNTER — Other Ambulatory Visit: Payer: Self-pay

## 2017-07-15 ENCOUNTER — Encounter: Payer: Self-pay | Admitting: Family Medicine

## 2017-07-15 VITALS — BP 108/74 | HR 78 | Temp 98.7°F | Resp 16 | Ht 63.39 in

## 2017-07-15 DIAGNOSIS — F4323 Adjustment disorder with mixed anxiety and depressed mood: Secondary | ICD-10-CM | POA: Insufficient documentation

## 2017-07-15 DIAGNOSIS — M1711 Unilateral primary osteoarthritis, right knee: Secondary | ICD-10-CM

## 2017-07-15 DIAGNOSIS — M1712 Unilateral primary osteoarthritis, left knee: Secondary | ICD-10-CM

## 2017-07-15 DIAGNOSIS — E559 Vitamin D deficiency, unspecified: Secondary | ICD-10-CM

## 2017-07-15 MED ORDER — DULOXETINE HCL 30 MG PO CPEP: 30.0000 mg | ORAL_CAPSULE | Freq: Every day | ORAL | 1 refills | Status: DC

## 2017-07-15 MED ORDER — ALPRAZOLAM 0.5 MG PO TABS: 0.5000 mg | ORAL_TABLET | Freq: Every evening | ORAL | 2 refills | Status: DC | PRN

## 2017-07-15 MED FILL — ALPRAZolam 0.5 MG TABS: 0.5 | 30 days supply | Qty: 30 | Fill #0

## 2017-07-15 MED FILL — DULoxetine HCL 30 MG CPEP: 30 | 90 days supply | Qty: 90 | Fill #0

## 2017-07-15 NOTE — Patient Instructions (Signed)
     IF you received an x-ray today, you will receive an invoice from [facility]. Please contact [facility] at [phone] with questions or concerns regarding your invoice.   IF you received labwork today, you will receive an invoice from [facility]. Please contact [facility] at [phone] with questions or concerns regarding your invoice.   Our billing staff will not be able to assist you with questions regarding bills from these companies.  You will be contacted with the lab results as soon as they are available. The fastest way to get your results is to activate your My Chart account. Instructions are located on the last page of this paperwork. If you have not heard from us regarding the results in 2 weeks, please contact this office.     
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
## 2017-07-19 ENCOUNTER — Encounter: Payer: Self-pay | Admitting: Family Medicine

## 2017-08-18 MED FILL — ALPRAZolam 0.5 MG TABS: 0.5 | 30 days supply | Qty: 30 | Fill #1

## 2017-08-20 ENCOUNTER — Ambulatory Visit: Payer: 59 | Admitting: Family Medicine

## 2017-08-20 DIAGNOSIS — M4802 Spinal stenosis, cervical region: Secondary | ICD-10-CM | POA: Diagnosis not present

## 2017-08-20 MED FILL — predniSONE 5 MG (21) TBPK: 5 | 6 days supply | Qty: 21 | Fill #0

## 2017-08-20 MED FILL — ACETAMINOPHEN/COD #3 TABLET: 300-30 | 10 days supply | Qty: 30 | Fill #0

## 2017-08-20 MED FILL — tiZANidine HCL 2 MG TABS: 2 | 5 days supply | Qty: 40 | Fill #0

## 2017-08-26 DIAGNOSIS — M5412 Radiculopathy, cervical region: Secondary | ICD-10-CM | POA: Diagnosis not present

## 2017-08-27 DIAGNOSIS — M5412 Radiculopathy, cervical region: Secondary | ICD-10-CM | POA: Diagnosis not present

## 2017-09-02 DIAGNOSIS — M5412 Radiculopathy, cervical region: Secondary | ICD-10-CM | POA: Diagnosis not present

## 2017-09-04 DIAGNOSIS — M5412 Radiculopathy, cervical region: Secondary | ICD-10-CM | POA: Diagnosis not present

## 2017-09-08 ENCOUNTER — Ambulatory Visit: Payer: 59 | Admitting: Psychology

## 2017-09-18 ENCOUNTER — Ambulatory Visit: Payer: Self-pay | Admitting: General Surgery

## 2017-09-18 DIAGNOSIS — D171 Benign lipomatous neoplasm of skin and subcutaneous tissue of trunk: Secondary | ICD-10-CM | POA: Diagnosis not present

## 2017-09-18 NOTE — H&P (Signed)
History of Present Illness Ralene Ok MD; 09/18/2017 11:00 AM) The patient is a 62 year old female who presents with a complaint of lipoma. Patient is a 62 year old female who comes in secondary to a left scapular lipoma. Patient states this is been there for approximately 4 years. States it is getting bigger over this period of time. She does have some discomfort to that area secondary to the lipoma. She had no signs or symptoms of infection or drainage from the area.   Past Surgical History Momina Hunton, RN; 09/18/2017 10:46 AM) Hysterectomy (due to cancer) - Complete  Knee Surgery  Bilateral. Oral Surgery  Shoulder Surgery  Bilateral. Tonsillectomy   Diagnostic Studies History Lindwood Coke, RN; 09/18/2017 10:46 AM) Colonoscopy  5-10 years ago Mammogram  1-3 years ago Pap Smear  1-5 years ago  Allergies Lindwood Coke, RN; 09/18/2017 10:46 AM) No Known Drug Allergies [09/18/2017]: Allergies Reconciled   Medication History (Shere Herrin, RN; 09/18/2017 10:47 AM) ALPRAZolam (0.5MG  Tablet, Oral) Active. DULoxetine HCl (20MG  Capsule DR Part, Oral) Active. Restasis (0.05% Emulsion, Ophthalmic) Active. B Complex Formula 1 (Oral) Active. Medications Reconciled  Social History Audine Mangione, RN; 09/18/2017 10:46 AM) Alcohol use  Moderate alcohol use. Caffeine use  Carbonated beverages, Coffee, Tea. No drug use  Tobacco use  Never smoker.  Family History Azlin Zilberman, RN; 09/18/2017 10:46 AM) Arthritis  Father, Mother. Depression  Mother. Diabetes Mellitus  Father. Hypertension  Father, Mother. Melanoma  Father.  Pregnancy / Birth History Yu Cragun, RN; 09/18/2017 10:46 AM) Age at menarche  48 years. Age of menopause  <45 Gravida  4 Length (months) of breastfeeding  7-12 Maternal age  50-35 Para  2 Regular periods   Other Problems Aalijah Mims, RN; 09/18/2017 10:46 AM) Anxiety Disorder  Depression  Hypercholesterolemia  Oophorectomy      Review of Systems Ralene Ok MD; 09/18/2017 10:59 AM) General Not Present- Appetite Loss, Chills, Fatigue, Fever, Night Sweats, Weight Gain and Weight Loss. Skin Not Present- Change in Wart/Mole, Dryness, Hives, Jaundice, New Lesions, Non-Healing Wounds, Rash and Ulcer. HEENT Present- Wears glasses/contact lenses. Not Present- Earache, Hearing Loss, Hoarseness, Nose Bleed, Oral Ulcers, Ringing in the Ears, Seasonal Allergies, Sinus Pain, Sore Throat, Visual Disturbances and Yellow Eyes. Respiratory Not Present- Bloody sputum, Chronic Cough, Difficulty Breathing, Snoring and Wheezing. Cardiovascular Not Present- Chest Pain, Difficulty Breathing Lying Down, Leg Cramps, Palpitations, Rapid Heart Rate, Shortness of Breath and Swelling of Extremities. Gastrointestinal Not Present- Abdominal Pain, Bloating, Bloody Stool, Change in Bowel Habits, Chronic diarrhea, Constipation, Difficulty Swallowing, Excessive gas, Gets full quickly at meals, Hemorrhoids, Indigestion, Nausea, Rectal Pain and Vomiting. Female Genitourinary Not Present- Frequency, Nocturia, Painful Urination, Pelvic Pain and Urgency. Musculoskeletal Present- Muscle Pain. Not Present- Back Pain, Joint Pain, Joint Stiffness, Muscle Weakness and Swelling of Extremities. Neurological Not Present- Decreased Memory, Fainting, Headaches, Numbness, Seizures, Tingling, Tremor, Trouble walking and Weakness. Psychiatric Present- Anxiety. Not Present- Bipolar, Change in Sleep Pattern, Depression, Fearful and Frequent crying. Endocrine Not Present- Cold Intolerance, Excessive Hunger, Hair Changes, Heat Intolerance, Hot flashes and New Diabetes. Hematology Not Present- Blood Thinners, Easy Bruising, Excessive bleeding, Gland problems, HIV and Persistent Infections. All other systems negative  Vitals (Ahley Herrin RN; 09/18/2017 10:48 AM) 09/18/2017 10:47 AM Weight: 162.13 lb Height: 65in Body Surface Area: 1.81 m Body Mass Index: 26.98  kg/m  Temp.: 98.21F(Oral)  Pulse: 76 (Regular)  P.OX: 98% (Room air) BP: 136/82 (Sitting, Left Arm, Standard)       Physical Exam Ralene Ok MD; 09/18/2017  11:00 AM) The physical exam findings are as follows: Note:Constitutional: No acute distress, conversant, appears stated age  Eyes: Anicteric sclerae, moist conjunctiva, no lid lag  Neck: No thyromegaly, trachea midline, no cervical lymphadenopathy  Lungs: Clear to auscultation biilaterally, normal respiratory effot  Cardiovascular: regular rate & rhythm, no murmurs, no peripheal edema, pedal pulses 2+  GI: Soft, no masses or hepatosplenomegaly, non-tender to palpation  MSK: Normal gait, no clubbing cyanosis, edema  Skin: No rashes, palpation reveals normal skin turgor, Left scapular mass 4x3cm SQ, mobile  Psychiatric: Appropriate judgment and insight, oriented to person, place, and time    Assessment & Plan Ralene Ok MD; 09/18/2017 11:01 AM) LIPOMA OF BACK (D17.1) Impression: 62 year old female with left scapular lipoma 1. The patient would like to proceed to the operating for excision by: 2. I discussed with her the risks and benefits of the procedure to include but not limited to: Infection, bleeding, damage structures, possible recurrence. Patient voiced understanding wishes to proceed.

## 2017-09-22 MED FILL — ALPRAZolam 0.5 MG TABS: 0.5 | 30 days supply | Qty: 30 | Fill #2

## 2017-10-07 ENCOUNTER — Ambulatory Visit: Payer: 59 | Admitting: Family Medicine

## 2017-10-14 ENCOUNTER — Telehealth: Payer: Self-pay | Admitting: General Practice

## 2017-10-14 NOTE — Telephone Encounter (Signed)
Called pt. To reschedule appt. With Dr. Pamella Pert on 11/20/17. Left VM to call back. If pt. Calls back please reschedule the pt.

## 2017-11-11 NOTE — Patient Instructions (Addendum)
Lacey [name]  11/11/2017   Your procedure is scheduled on: 11-25-17   Report to [facility] Main  Entrance             Report to admitting at     1245  pM    Call this number if you have problems the morning of surgery [phone]    Remember: Do not eat food:After Midnight. You may have clear liquids until 0845 then nothing by mouth    CLEAR LIQUID DIET   Foods Allowed                                                                     Foods Excluded  Coffee and tea, regular and decaf                             liquids that you cannot  Plain Jell-O in any flavor                                             see through such as: Fruit ices (not with fruit pulp)                                     milk, soups, orange juice  Iced Popsicles                                    All solid food Carbonated beverages, regular and diet                                    Cranberry, grape and apple juices Sports drinks like Gatorade Lightly seasoned clear broth or consume(fat free) Sugar, honey syrup   _____________________________________________________________________    BRUSH YOUR TEETH MORNING OF SURGERY AND RINSE YOUR MOUTH OUT, NO CHEWING GUM CANDY OR MINTS.     Take these medicines the morning of surgery with A SIP OF WATER:  eye drops as usual                                You may not have any metal on your body including hair pins and              piercings  Do not wear jewelry, make-up, lotions, powders or perfumes, deodorant             Do not wear nail polish.  Do not shave  48 hours prior to surgery.      Do not bring valuables to the hospital. [place].  Contacts, dentures or bridgework may not be worn into surgery.  Leave  suitcase in the car. After surgery it may be brought to your room.     Patients discharged the day of surgery will not be allowed to drive home.  Name and phone  number of your driver:  Special Instructions: N/A              Please read over the following fact sheets you were given: _____________________________________________________________________             [facility] - Preparing for Surgery Before surgery, you can play an important role.  Because skin is not sterile, your skin needs to be as free of germs as possible.  You can reduce the number of germs on your skin by washing with CHG (chlorahexidine gluconate) soap before surgery.  CHG is an antiseptic cleaner which kills germs and bonds with the skin to continue killing germs even after washing. Please DO NOT use if you have an allergy to CHG or antibacterial soaps.  If your skin becomes reddened/irritated stop using the CHG and inform your nurse when you arrive at Short Stay. Do not shave (including legs and underarms) for at least 48 hours prior to the first CHG shower.  You may shave your face/neck. Please follow these instructions carefully:  1.  Shower with CHG Soap the night before surgery and the  morning of Surgery.  2.  If you choose to wash your hair, wash your hair first as usual with your  normal  shampoo.  3.  After you shampoo, rinse your hair and body thoroughly to remove the  shampoo.                           4.  Use CHG as you would any other liquid soap.  You can apply chg directly  to the skin and wash                       Gently with a scrungie or clean washcloth.  5.  Apply the CHG Soap to your body ONLY FROM THE NECK DOWN.   Do not use on face/ open                           Wound or open sores. Avoid contact with eyes, ears mouth and genitals (private parts).                       Wash face,  Genitals (private parts) with your normal soap.             6.  Wash thoroughly, paying special attention to the area where your surgery  will be performed.  7.  Thoroughly rinse your body with warm water from the neck down.  8.  DO NOT shower/wash with your normal soap after  using and rinsing off  the CHG Soap.                9.  Pat yourself dry with a clean towel.            10.  Wear clean pajamas.            11.  Place clean sheets on your bed the night of your first shower and do not  sleep with pets. Day of Surgery : Do not apply any lotions/deodorants the morning of surgery.  Please wear clean clothes to the hospital/surgery center.  FAILURE TO FOLLOW THESE INSTRUCTIONS MAY RESULT IN THE CANCELLATION OF YOUR SURGERY PATIENT SIGNATURE_________________________________  NURSE SIGNATURE__________________________________  ________________________________________________________________________

## 2017-11-13 ENCOUNTER — Ambulatory Visit: Payer: 59 | Admitting: Family Medicine

## 2017-11-13 ENCOUNTER — Encounter (HOSPITAL_COMMUNITY): Payer: Self-pay | Admitting: Anesthesiology

## 2017-11-13 ENCOUNTER — Ambulatory Visit (HOSPITAL_COMMUNITY)
Admission: RE | Admit: 2017-11-13 | Discharge: 2017-11-13 | Disposition: A | Discharge status: Home / Self Care | Payer: 59 | Source: Ambulatory Visit | Attending: Gastroenterology | Admitting: Gastroenterology

## 2017-11-13 ENCOUNTER — Ambulatory Visit (HOSPITAL_COMMUNITY): Payer: 59 | Admitting: Anesthesiology

## 2017-11-13 ENCOUNTER — Other Ambulatory Visit: Payer: Self-pay

## 2017-11-13 ENCOUNTER — Encounter (HOSPITAL_COMMUNITY)
Admission: RE | Disposition: A | Discharge status: Home / Self Care | Payer: Self-pay | Source: Ambulatory Visit | Attending: Gastroenterology

## 2017-11-13 DIAGNOSIS — X58XXXA Exposure to other specified factors, initial encounter: Secondary | ICD-10-CM | POA: Insufficient documentation

## 2017-11-13 DIAGNOSIS — Z885 Allergy status to narcotic agent status: Secondary | ICD-10-CM | POA: Insufficient documentation

## 2017-11-13 DIAGNOSIS — K449 Diaphragmatic hernia without obstruction or gangrene: Secondary | ICD-10-CM | POA: Diagnosis not present

## 2017-11-13 DIAGNOSIS — K210 Gastro-esophageal reflux disease with esophagitis: Secondary | ICD-10-CM | POA: Insufficient documentation

## 2017-11-13 DIAGNOSIS — Z79899 Other long term (current) drug therapy: Secondary | ICD-10-CM | POA: Diagnosis not present

## 2017-11-13 DIAGNOSIS — K222 Esophageal obstruction: Secondary | ICD-10-CM | POA: Diagnosis not present

## 2017-11-13 DIAGNOSIS — T18128A Food in esophagus causing other injury, initial encounter: Secondary | ICD-10-CM | POA: Diagnosis not present

## 2017-11-13 DIAGNOSIS — F419 Anxiety disorder, unspecified: Secondary | ICD-10-CM | POA: Diagnosis not present

## 2017-11-13 DIAGNOSIS — M1990 Unspecified osteoarthritis, unspecified site: Secondary | ICD-10-CM | POA: Diagnosis not present

## 2017-11-13 DIAGNOSIS — T182XXA Foreign body in stomach, initial encounter: Secondary | ICD-10-CM | POA: Diagnosis not present

## 2017-11-13 HISTORY — PX: ESOPHAGOGASTRODUODENOSCOPY (EGD) WITH PROPOFOL: SHX5813

## 2017-11-13 HISTORY — PX: FOREIGN BODY REMOVAL: SHX962

## 2017-11-13 SURGERY — ESOPHAGOGASTRODUODENOSCOPY (EGD) WITH PROPOFOL
Anesthesia: Monitor Anesthesia Care

## 2017-11-13 MED ORDER — PROPOFOL 500 MG/50ML IV EMUL
INTRAVENOUS | Status: DC | PRN
  Administered 2017-11-13: 100 ug/kg/min via INTRAVENOUS

## 2017-11-13 MED ORDER — MIDAZOLAM HCL 2 MG/2ML IJ SOLN
INTRAMUSCULAR | Status: DC | PRN
  Administered 2017-11-13: 2 mg via INTRAVENOUS

## 2017-11-13 MED ORDER — PROPOFOL 10 MG/ML IV BOLUS
INTRAVENOUS | Status: DC | PRN
  Administered 2017-11-13: 30 mg via INTRAVENOUS

## 2017-11-13 MED ORDER — LACTATED RINGERS IV SOLN
INTRAVENOUS | Status: DC | PRN
  Administered 2017-11-13: 07:00:00 via INTRAVENOUS

## 2017-11-13 MED ORDER — LIDOCAINE HCL (CARDIAC) PF 100 MG/5ML IV SOSY
PREFILLED_SYRINGE | INTRAVENOUS | Status: DC | PRN
  Administered 2017-11-13: 40 mg via INTRAVENOUS

## 2017-11-13 MED FILL — PANTOPRAZOLE SOD DR 40 MG T: 40 | 30 days supply | Qty: 30 | Fill #0

## 2017-11-13 SURGICAL SUPPLY — 15 items

## 2017-11-13 NOTE — Addendum Note (Signed)
Addendum  created 11/13/17 0857 by Kyung Rudd, CRNA   Intraprocedure Flowsheets edited

## 2017-11-13 NOTE — Transfer of Care (Signed)
Immediate Anesthesia Transfer of Care Note  Patient: Lacey [name]  Procedure(s) Performed: ESOPHAGOGASTRODUODENOSCOPY (EGD) WITH PROPOFOL (N/A )  Patient Location: Endoscopy Unit  Anesthesia Type:MAC  Level of Consciousness: awake, alert  and oriented  Airway & Oxygen Therapy: Patient Spontanous Breathing and Patient connected to nasal cannula oxygen  Post-op Assessment: Report given to RN and Post -op Vital signs reviewed and stable  Post vital signs: Reviewed and stable  Last Vitals:  Vitals Value Taken Time  BP    Temp    Pulse    Resp    SpO2      Last Pain:  Vitals:   11/13/17 0710  TempSrc: Oral  PainSc: 5       Patients Stated Pain Goal: 5 (36/14/43 1540)  Complications: No apparent anesthesia complications

## 2017-11-13 NOTE — Anesthesia Preprocedure Evaluation (Addendum)
Anesthesia Evaluation  Patient identified by MRN, date of birth, ID band Patient awake    Reviewed: Allergy & Precautions, NPO status , Patient's Chart, lab work & pertinent test results  Airway Mallampati: I       Dental no notable dental hx. (+) Teeth Intact, Dental Advisory Given   Pulmonary neg pulmonary ROS,    Pulmonary exam normal breath sounds clear to auscultation       Cardiovascular Normal cardiovascular exam Rhythm:Regular Rate:Normal     Neuro/Psych PSYCHIATRIC DISORDERS Anxiety negative neurological ROS     GI/Hepatic Neg liver ROS,   Endo/Other  negative endocrine ROS  Renal/GU negative Renal ROS  negative genitourinary   Musculoskeletal   Abdominal Normal abdominal exam  (+)   Peds  Hematology negative hematology ROS (+)   Anesthesia Other Findings   Reproductive/Obstetrics                            Anesthesia Physical Anesthesia Plan  ASA: II  Anesthesia Plan: MAC   Post-op Pain Management:    Induction:   PONV Risk Score and Plan: 2 and Ondansetron, Dexamethasone and Propofol infusion  Airway Management Planned: Natural Airway and Mask  Additional Equipment:   Intra-op Plan:   Post-operative Plan:   Informed Consent: I have reviewed the patients History and Physical, chart, labs and discussed the procedure including the risks, benefits and alternatives for the proposed anesthesia with the patient or authorized representative who has indicated his/her understanding and acceptance.   Dental advisory given  Plan Discussed with: CRNA  Anesthesia Plan Comments:         Anesthesia Quick Evaluation

## 2017-11-13 NOTE — Op Note (Addendum)
[facility] Patient Name: Lacey [name] Procedure Date : 11/13/2017 MRN: [id number] Attending MD: Clarene Essex , MD Date of Birth: 1956-01-10 CSN: [id number] Age: 62 Admit Type: Outpatient Procedure:                Upper GI endoscopy Indications:              Foreign body in the esophagus Providers:                Clarene Essex, MD, Carlyn Reichert, RN, Charolette Child,                            Technician, Rhae Lerner, CRNA Referring MD:              Medicines:                Propofol total dose 190 mg IV, Midazolam 2 mg IV,                            40 mg IV lidocaine Complications:            No immediate complications. Estimated Blood Loss:     Estimated blood loss: none. Procedure:                Pre-Anesthesia Assessment:                           - Prior to the procedure, a History and Physical                            was performed, and patient medications and                            allergies were reviewed. The patient's tolerance of                            previous anesthesia was also reviewed. The risks                            and benefits of the procedure and the sedation                            options and risks were discussed with the patient.                            All questions were answered, and informed consent                            was obtained. Prior Anticoagulants: The patient has                            taken no previous anticoagulant or antiplatelet                            agents. ASA Grade Assessment: I - A normal, healthy  patient. After reviewing the risks and benefits,                            the patient was deemed in satisfactory condition to                            undergo the procedure.                           After obtaining informed consent, the endoscope was                            passed under direct vision. Throughout the                            procedure, the patient's  blood pressure, pulse, and                            oxygen saturations were monitored continuously. The                            GIF-H190 ([id number]) Olympus adult EGD was introduced                            through the mouth, and advanced to the second part                            of duodenum. The upper GI endoscopy was                            accomplished without difficulty. The patient                            tolerated the procedure fairly well. Scope In: Scope Out: Findings:      The larynx was normal.      Food was found in the lower third of the esophagus. Removal of food was       accomplished by snaring the food bolus and it accidentally cut it in       half and passed readily into the stomach and the endoscopy was completed       as below.      A medium-sized hiatal hernia was present.      One benign-appearing, intrinsic mild stenosis was found. The stenosis       was traversed.      LA Grade A (one or more mucosal breaks less than 5 mm, not extending       between tops of 2 mucosal folds) esophagitis with no bleeding was found.      A small amount of food (residue) was found in the gastric fundus.      The duodenal bulb, first portion of the duodenum and second portion of       the duodenum were normal.      The exam was otherwise without abnormality. Impression:               - Normal larynx.                           -  Food in the lower third of the esophagus. Removal                            was successful.                           - Medium-sized hiatal hernia.                           - Benign-appearing esophageal stenosis.                           - LA Grade A reflux and erosive esophagitis.                           - A small amount of food (residue) in the stomach.                           - Normal duodenal bulb, first portion of the                            duodenum and second portion of the duodenum.                           - The examination  was otherwise normal. Moderate Sedation:      moderate sedation-none Recommendation:           - Patient has a contact number available for                            emergencies. The signs and symptoms of potential                            delayed complications were discussed with the                            patient. Return to normal activities tomorrow.                            Written discharge instructions were provided to the                            patient.                           - Clear liquid diet for 4 hours. if Okay soft                            solids today                           - Continue present medications.                           - Use Protonix (pantoprazole) 40 mg PO daily  indefinitely.                           - Return to GI clinic in 2 weeks.                           - Telephone GI clinic if symptomatic PRN. Procedure Code(s):        --- Professional ---                           [phone], Esophagogastroduodenoscopy, flexible,                            transoral; with removal of foreign body(s) Diagnosis Code(s):        --- Professional ---                           F74.944H, Food in esophagus causing other injury,                            initial encounter                           K44.9, Diaphragmatic hernia without obstruction or                            gangrene                           K22.2, Esophageal obstruction                           K21.0, Gastro-esophageal reflux disease with                            esophagitis                           K20.8, Other esophagitis                           T18.2XXA, Foreign body in stomach, initial encounter                           T18.108A, Unspecified foreign body in esophagus                            causing other injury, initial encounter CPT copyright 2018 American [employer] Association. All rights reserved. The codes documented in this report are preliminary  and upon coder review may  be revised to meet current compliance requirements. Clarene Essex, MD 11/13/2017 8:08:55 AM This report has been signed electronically. Number of Addenda: 0

## 2017-11-13 NOTE — Discharge Instructions (Signed)

## 2017-11-13 NOTE — Anesthesia Procedure Notes (Signed)
Procedure Name: MAC Date/Time: 11/13/2017 7:43 AM Performed by: Kyung Rudd, CRNA Pre-anesthesia Checklist: Patient identified, Suction available, Emergency Drugs available, Patient being monitored and Timeout performed Patient Re-evaluated:Patient Re-evaluated prior to induction Oxygen Delivery Method: Nasal cannula Induction Type: IV induction Placement Confirmation: positive ETCO2 Dental Injury: Teeth and Oropharynx as per pre-operative assessment

## 2017-11-13 NOTE — H&P (Addendum)
Lacey [name] is an 62 y.o. female.   Chief Complaint: Food impaction HPI: Patient with a long history of episodic swallowing issues who has had dilations and food impactions in the past her last dilation was 2012 and she is been having more swallowing problems over the last year but she does not take any stomach medicine and last night while eating chicken and to get it caught and called me last night and we discussed coming to the hospital at that time versus meeting first thing in the morning she was instructed to call me if she got worse otherwise we will plan to endoscopy this morning hospital computer chart reviewed and her previous barium swallow and endoscopy were reviewed Past Medical History:  Diagnosis Date  . Anxiety   . Arthritis   . GERD (gastroesophageal reflux disease)   . Rotator cuff tear     Past Surgical History:  Procedure Laterality Date  . ANTERIOR AND POSTERIOR REPAIR  2003   with the TAH  . BICEPT TENODESIS  11/05/2011   Procedure: BICEPS TENODESIS;  Surgeon: Hessie Dibble, MD;  Location: [place];  Service: Orthopedics;  Laterality: Right;   TENODESIS  AND  ACRMIOCLAVICULAR RESECTION   . ERCP  2012  . HARDWARE REMOVAL  2010   left ankle  . JOINT REPLACEMENT Right 2015  . KNEE ARTHROSCOPY     right and left  . ORIF ANKLE FRACTURE  2009   left  . SHOULDER ARTHROSCOPY WITH ROTATOR CUFF REPAIR AND OPEN BICEPS TENODESIS Left 11/18/2014   Procedure: SHOULDER ARTHROSCOPY WITH ROTATOR CUFF REPAIR AND OPEN BICEPS TENODESIS;  Surgeon: Melrose Nakayama, MD;  Location: [place];  Service: Orthopedics;  Laterality: Left;  . SHOULDER OPEN ROTATOR CUFF REPAIR  11/05/2011   Procedure: ROTATOR CUFF REPAIR SHOULDER OPEN;  Surgeon: Hessie Dibble, MD;  Location: [place];  Service: Orthopedics;  Laterality: Right;  RIGHT SHOULDER OPEN ROTATOR CUFF REPAIR  . TONSILLECTOMY    . TOTAL ABDOMINAL HYSTERECTOMY  2003   rt ovarian  mass.  LEFT ovary remaining.  Marland Kitchen TOTAL KNEE ARTHROPLASTY Right 11/09/2013   Procedure: TOTAL KNEE ARTHROPLASTY;  Surgeon: Hessie Dibble, MD;  Location: [place];  Service: Orthopedics;  Laterality: Right;  . TOTAL KNEE ARTHROPLASTY Left 01/02/2016   Procedure: TOTAL KNEE ARTHROPLASTY;  Surgeon: Melrose Nakayama, MD;  Location: [place];  Service: Orthopedics;  Laterality: Left;  . UPPER GASTROINTESTINAL ENDOSCOPY      Family History  Problem Relation Age of Onset  . Depression Mother   . Hypertension Mother   . Diabetes Father    Social History:  reports that she has never smoked. She has never used smokeless tobacco. She reports that she does not drink alcohol or use drugs.  Allergies:  Allergies  Allergen Reactions  . Morphine And Related Other (See Comments)    "feels crazy"    Medications Prior to Admission  Medication Sig Dispense Refill  . ALPRAZolam (XANAX) 0.5 MG tablet Take 1 tablet (0.5 mg total) by mouth at bedtime as needed for anxiety. 30 tablet 2  . B Complex-Folic Acid (B COMPLEX-VITAMIN B12 PO) Take 1 tablet by mouth daily.    . cycloSPORINE (RESTASIS) 0.05 % ophthalmic emulsion Place 1 drop into both eyes daily.     . diphenhydrAMINE (BENADRYL) 25 MG tablet Take 50 mg by mouth at bedtime as needed for sleep.     . DULoxetine (CYMBALTA) 30 MG capsule Take 1  capsule (30 mg total) by mouth at bedtime. 90 capsule 1  . ibuprofen (ADVIL,MOTRIN) 200 MG tablet Take 200-400 mg by mouth 2 (two) times daily as needed (for pain.).    Marland Kitchen mupirocin ointment (BACTROBAN) 2 % Place 1 application into the nose daily.    . vitamin E 1000 UNIT capsule Take 1,000 Units by mouth daily.      No results found for this or any previous visit (from the past 48 hour(s)). No results found.  ROS except above she is not on any aspirin or blood thinners  Blood pressure 140/88, temperature 98.2 F (36.8 C), temperature source Oral, resp. rate 12, height 5' 3.39" (1.61 m), weight 67.1 kg, SpO2 95  %. Physical Exam signs stable afebrile no acute distress exam please see previous assessment evaluation  Assessment/Plan Food impaction in patient with history of dysphasia Plan: The risks of procedure were discussed with the patient and her husband and proceed this morning with anesthesia assistance for further work-up and plans and possible dilation pending those findings and possible pump inhibitor use  Excell Neyland E, MD 11/13/2017, 7:39 AM

## 2017-11-13 NOTE — Anesthesia Postprocedure Evaluation (Signed)
Anesthesia Post Note  Patient: Candie M Olshefski  Procedure(s) Performed: ESOPHAGOGASTRODUODENOSCOPY (EGD) WITH PROPOFOL (N/A )     Patient location during evaluation: Endoscopy Anesthesia Type: MAC Level of consciousness: awake Pain management: pain level controlled Vital Signs Assessment: post-procedure vital signs reviewed and stable Respiratory status: spontaneous breathing Cardiovascular status: stable Postop Assessment: no apparent nausea or vomiting Anesthetic complications: no    Last Vitals:  Vitals:   11/13/17 0710 11/13/17 0809  BP: 140/88 108/75  Resp: 12 19  Temp: 36.8 C 36.9 C  SpO2: 95% 95%    Last Pain:  Vitals:   11/13/17 0809  TempSrc: Oral  PainSc: 0-No pain   Pain Goal: Patients Stated Pain Goal: 5 (11/13/17 0710)               Huston Foley

## 2017-11-17 ENCOUNTER — Encounter (HOSPITAL_COMMUNITY): Payer: Self-pay

## 2017-11-17 ENCOUNTER — Encounter (HOSPITAL_COMMUNITY)
Admission: RE | Admit: 2017-11-17 | Discharge: 2017-11-17 | Disposition: A | Discharge status: Home / Self Care | Payer: 59 | Source: Ambulatory Visit | Attending: General Surgery | Admitting: General Surgery

## 2017-11-17 DIAGNOSIS — Z01818 Encounter for other preprocedural examination: Secondary | ICD-10-CM | POA: Insufficient documentation

## 2017-11-17 DIAGNOSIS — R229 Localized swelling, mass and lump, unspecified: Secondary | ICD-10-CM | POA: Diagnosis not present

## 2017-11-17 LAB — CBC
HCT: 39.5 % (ref 36.0–46.0)
HEMOGLOBIN: 12.5 g/dL (ref 12.0–15.0)
MCH: 29.5 pg (ref 26.0–34.0)
MCHC: 31.6 g/dL (ref 30.0–36.0)
MCV: 93.2 fL (ref 80.0–100.0)
NRBC: 0 % (ref 0.0–0.2)
Platelets: 297 10*3/uL (ref 150–400)
RBC: 4.24 MIL/uL (ref 3.87–5.11)
RDW: 13.2 % (ref 11.5–15.5)
WBC: 4 10*3/uL (ref 4.0–10.5)

## 2017-11-19 DIAGNOSIS — H524 Presbyopia: Secondary | ICD-10-CM | POA: Diagnosis not present

## 2017-11-20 ENCOUNTER — Ambulatory Visit: Payer: 59 | Admitting: Family Medicine

## 2017-11-25 ENCOUNTER — Ambulatory Visit (HOSPITAL_COMMUNITY): Admission: RE | Admit: 2017-11-25 | Payer: 59 | Source: Ambulatory Visit | Admitting: General Surgery

## 2017-11-25 ENCOUNTER — Encounter (HOSPITAL_COMMUNITY): Admission: RE | Payer: Self-pay | Source: Ambulatory Visit

## 2017-11-25 SURGERY — EXCISION MASS
Anesthesia: Monitor Anesthesia Care | Laterality: Left

## 2017-11-28 IMAGING — DX DG CHEST 2V
2 series · 2 of 2 positions shown · non-contrast
Comparison: 12/25/2015

CLINICAL DATA: Cough 1 month

EXAM:
CHEST  2 VIEW

[chest pa]
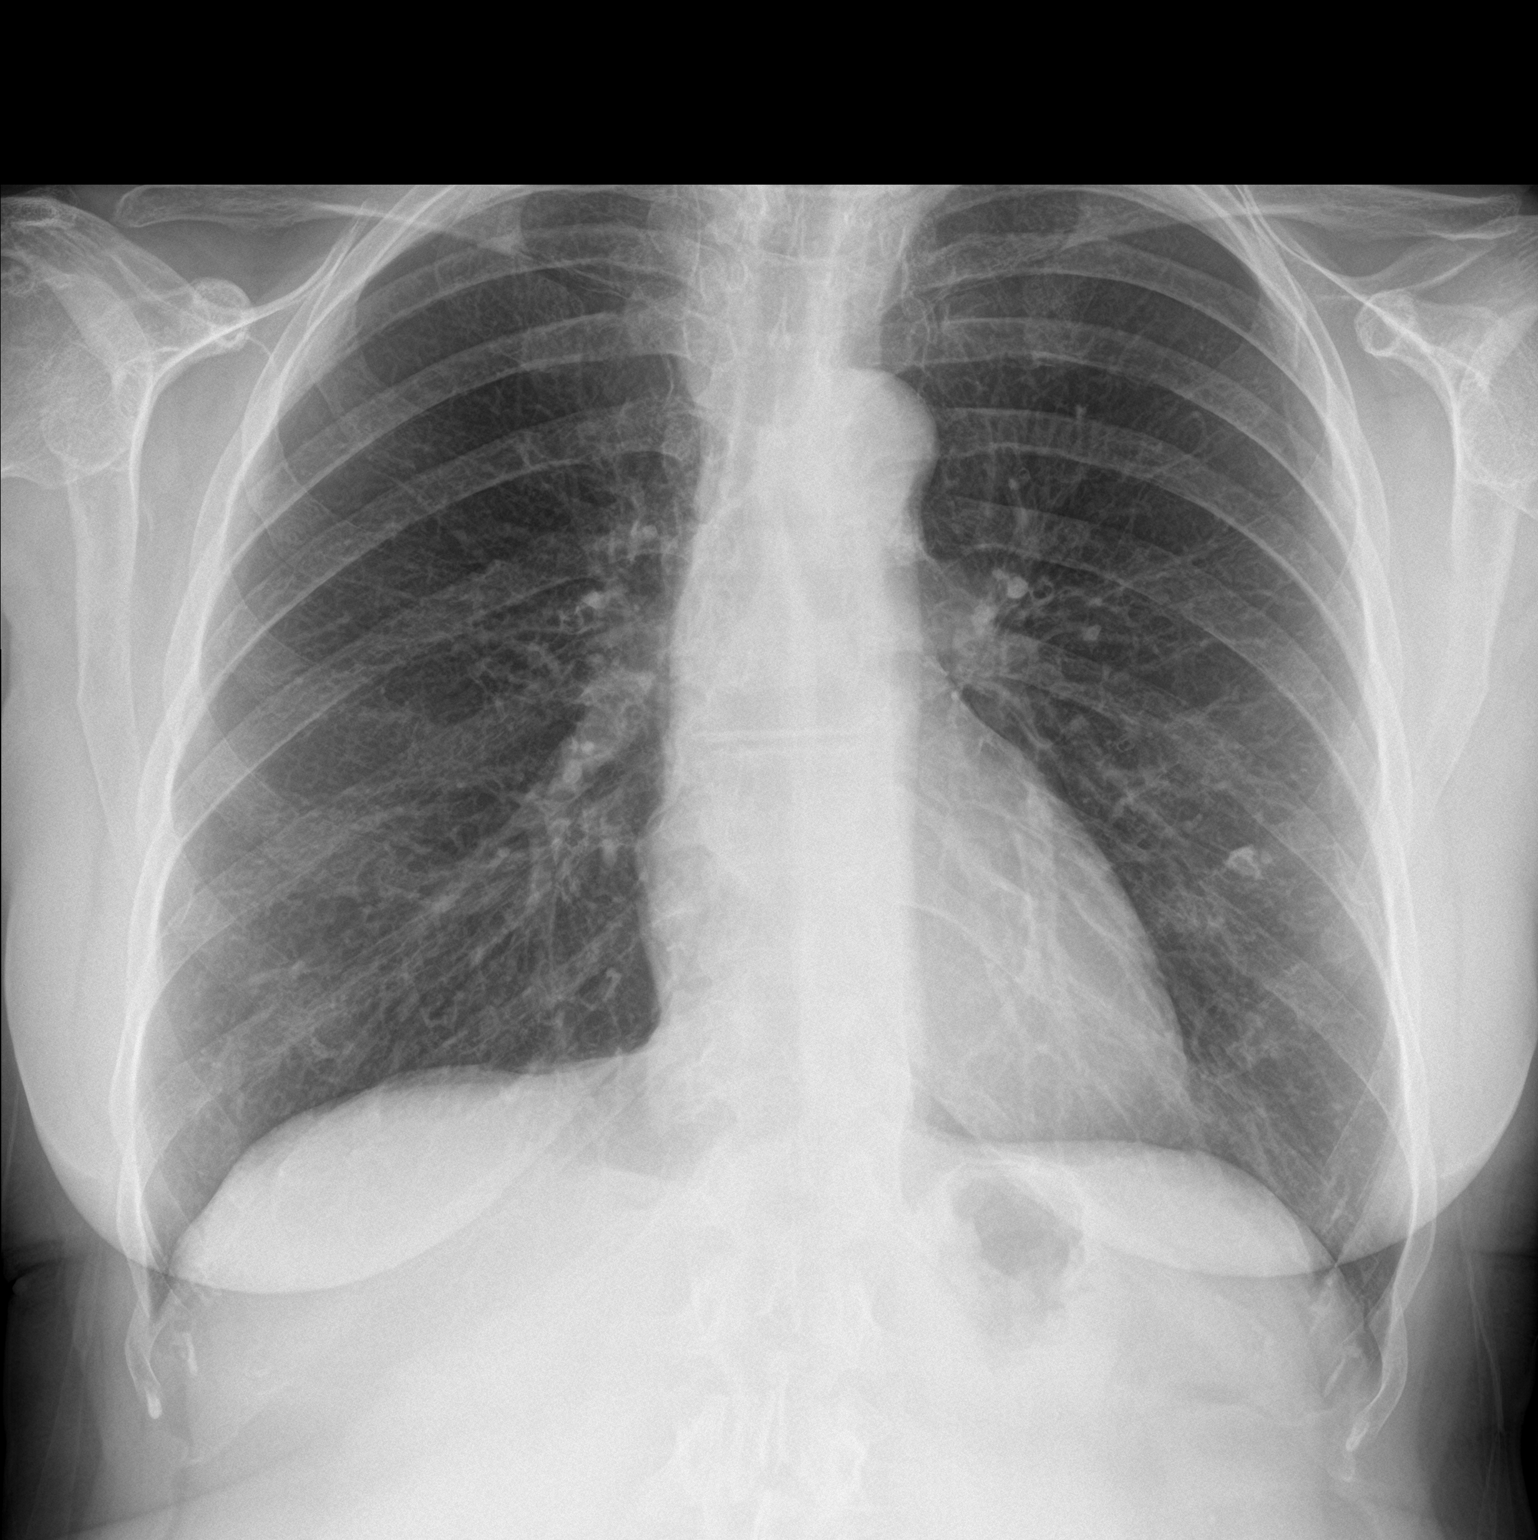

[chest lat]
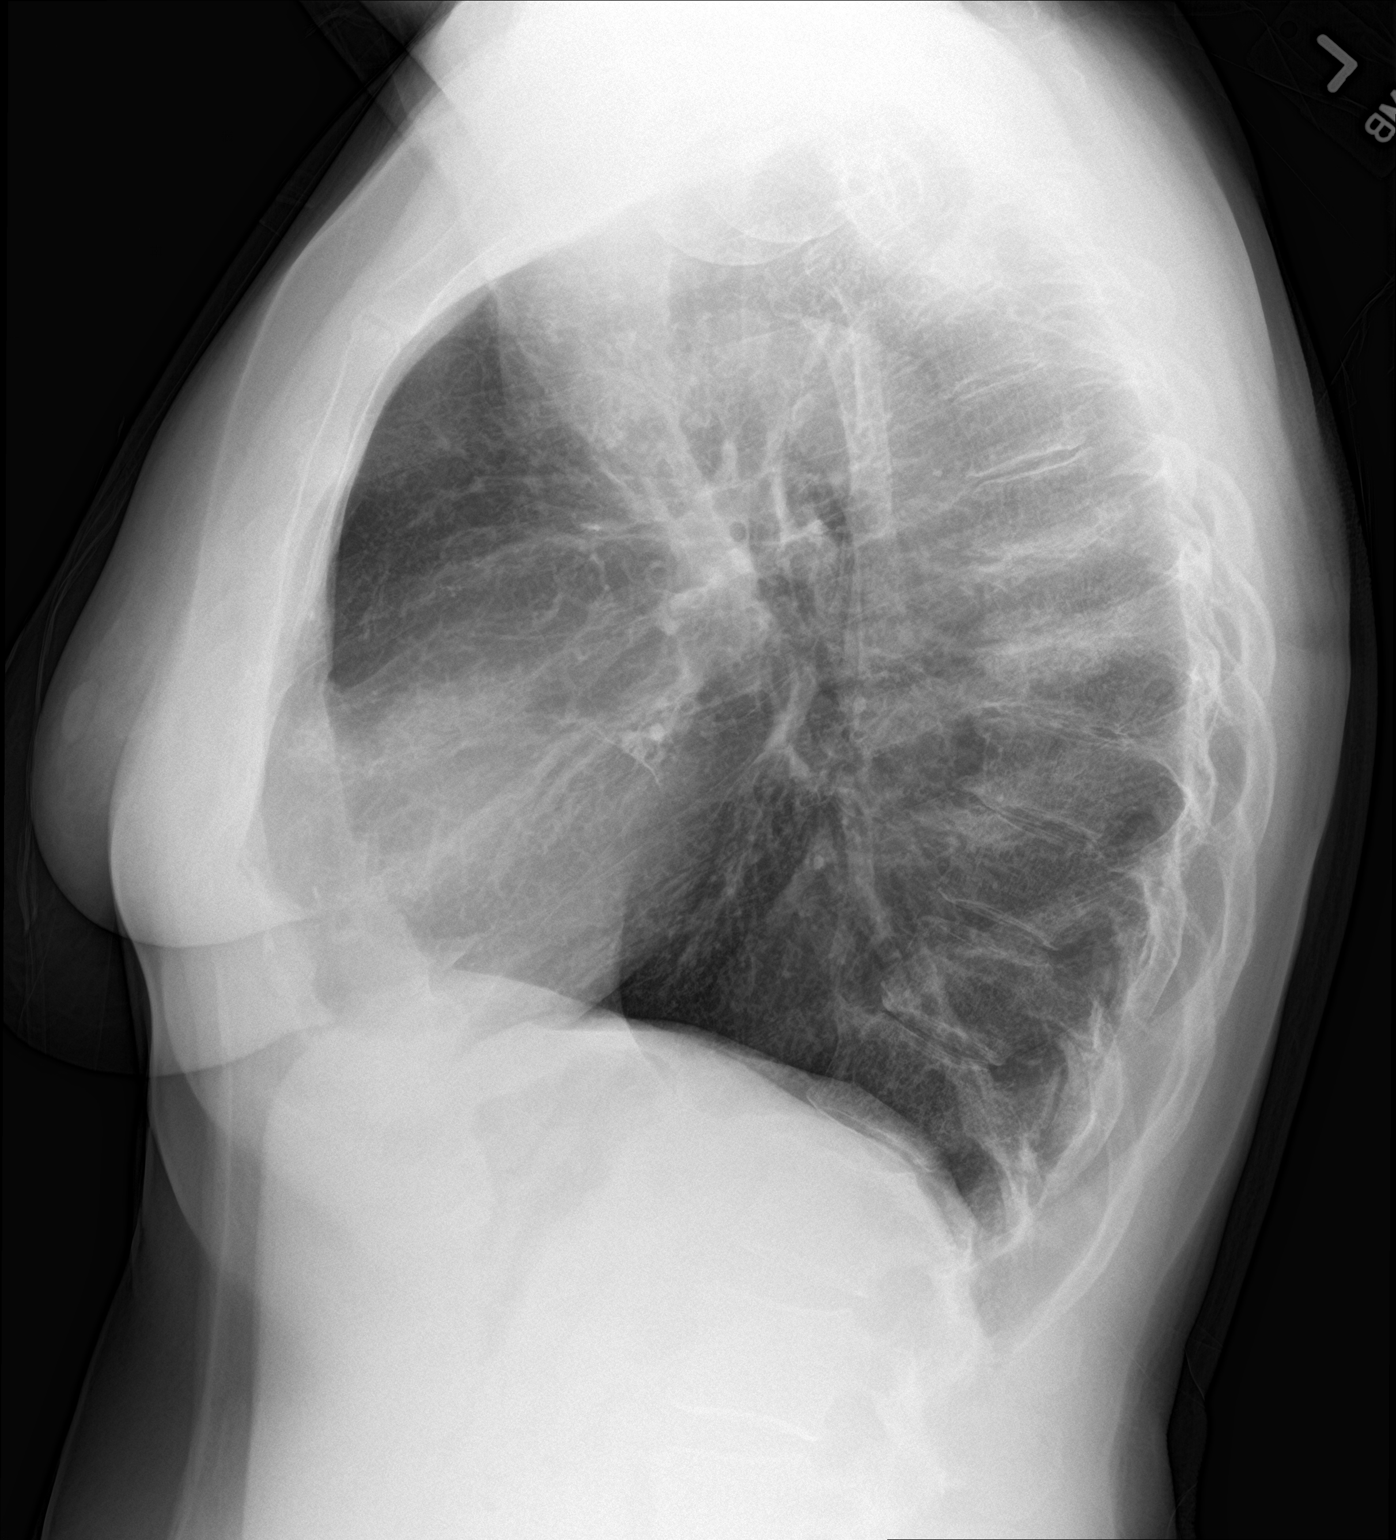

[2 of 2 positions shown; findings below may reference images not displayed]

FINDINGS: COPD with pulmonary hyperinflation. Calcified granuloma in the
lingula. This is unchanged. Negative for infiltrate effusion or
heart failure. No change from the prior study. Mild compression
fracture midthoracic spine unchanged
IMPRESSION: COPD.  No acute cardiopulmonary abnormality.

## 2017-12-01 ENCOUNTER — Ambulatory Visit: Payer: 59 | Admitting: Family Medicine

## 2017-12-01 ENCOUNTER — Encounter: Payer: Self-pay | Admitting: Family Medicine

## 2017-12-01 VITALS — BP 116/72 | HR 94 | Temp 98.0°F | Resp 16 | Ht 65.0 in | Wt 163.0 lb

## 2017-12-01 DIAGNOSIS — R7303 Prediabetes: Secondary | ICD-10-CM

## 2017-12-01 DIAGNOSIS — F4323 Adjustment disorder with mixed anxiety and depressed mood: Secondary | ICD-10-CM

## 2017-12-01 DIAGNOSIS — K449 Diaphragmatic hernia without obstruction or gangrene: Secondary | ICD-10-CM

## 2017-12-01 DIAGNOSIS — Z1211 Encounter for screening for malignant neoplasm of colon: Secondary | ICD-10-CM

## 2017-12-01 DIAGNOSIS — K2100 Gastro-esophageal reflux disease with esophagitis, without bleeding: Secondary | ICD-10-CM

## 2017-12-01 DIAGNOSIS — E785 Hyperlipidemia, unspecified: Secondary | ICD-10-CM

## 2017-12-01 MED ORDER — DULOXETINE HCL 40 MG PO CPEP: 40.0000 mg | ORAL_CAPSULE | Freq: Every day | ORAL | 1 refills | Status: DC

## 2017-12-01 MED ORDER — ALPRAZOLAM 0.5 MG PO TABS: 0.5000 mg | ORAL_TABLET | Freq: Every evening | ORAL | 2 refills | Status: AC | PRN

## 2017-12-01 MED FILL — DULoxetine HCL 30 MG CPEP: 30 | 90 days supply | Qty: 90 | Fill #1

## 2017-12-01 MED FILL — ALPRAZolam 0.5 MG TABS: 0.5 | 30 days supply | Qty: 30 | Fill #0

## 2017-12-01 NOTE — Patient Instructions (Signed)
° ° ° °  If you have lab work done today you will be contacted with your lab results within the next 2 weeks.  If you have not heard from us then please contact us. The fastest way to get your results is to register for My Chart. ° ° °IF you received an x-ray today, you will receive an invoice from [facility]. Please contact [facility] at [phone] with questions or concerns regarding your invoice.  ° °IF you received labwork today, you will receive an invoice from [facility]. Please contact [facility] at [phone] with questions or concerns regarding your invoice.  ° °Our billing staff will not be able to assist you with questions regarding bills from these companies. ° °You will be contacted with the lab results as soon as they are available. The fastest way to get your results is to activate your My Chart account. Instructions are located on the last page of this paperwork. If you have not heard from us regarding the results in 2 weeks, please contact this office. °  ° ° ° °

## 2017-12-01 NOTE — Progress Notes (Signed)
11/18/20193:05 PM  Lacey [name] 1955/08/04, 62 y.o. female [id number]  Chief Complaint  Patient presents with  . Medication Refill    cymbalta and xanax    HPI:   Patient is a 62 y.o. 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 No No     Depression screen [facility] 2/9 12/01/2017 07/15/2017 05/15/2016  Decreased Interest 1 2 0  Down, Depressed, Hopeless 1 2 0  PHQ - 2 Score 2 4 0  Altered sleeping 1 0 -  Tired, decreased energy 1 1 -  Change in appetite 1 2 -  Feeling bad or failure about yourself  1 3 -  Trouble concentrating 0 1 -  Moving slowly or fidgety/restless 0 0 -  Suicidal thoughts 0 0 -  PHQ-9 Score 6 11 -  Difficult doing work/chores Not difficult at all Not difficult at all -   GAD 7 : Generalized Anxiety Score 12/01/2017 07/15/2017 07/13/2015  Nervous, Anxious, on Edge 3 3 1   Control/stop worrying 2 2 0  Worry too much - different things 1 1 0  Trouble relaxing 3 2 0  Restless 1 2 0  Easily annoyed or irritable 1 2 0  Afraid - awful might happen 0 0 0  Total GAD 7 Score 11 12 1   Anxiety Difficulty Not difficult at all Somewhat difficult Not difficult at all     Allergies  Allergen Reactions  . Morphine And Related Other (See Comments)    "feels crazy"    Prior to Admission medications   Medication Sig Start Date End Date Taking?  Authorizing Provider  ALPRAZolam Duanne Moron) 0.5 MG tablet Take 1 tablet (0.5 mg total) by mouth at bedtime as needed for anxiety. 07/15/17  Yes Wardell Honour, MD  B Complex-Folic Acid (B COMPLEX-VITAMIN B12 PO) Take 1 tablet by mouth daily.   Yes [provider]  cycloSPORINE (RESTASIS) 0.05 % ophthalmic emulsion Place 1 drop into both eyes daily.    Yes [provider]  diphenhydrAMINE (BENADRYL) 25 MG tablet Take 50 mg by mouth at bedtime as needed for sleep.    Yes [provider]  DULoxetine (CYMBALTA) 30 MG capsule Take 1 capsule (30 mg total) by mouth at bedtime. 07/15/17  Yes Wardell Honour, MD  ibuprofen (ADVIL,MOTRIN) 200 MG tablet Take 200-400 mg by mouth 2 (two) times daily as needed (for pain.).   Yes [provider]  vitamin E 1000 UNIT capsule Take 1,000 Units by mouth daily.   Yes [provider]  mupirocin ointment (BACTROBAN) 2 % Place 1 application into the nose daily.    [provider]    Past Medical History:  Diagnosis Date  . Anxiety   . Arthritis   . GERD (gastroesophageal reflux disease)   . Rotator cuff tear     Past Surgical History:  Procedure Laterality Date  . ANTERIOR AND POSTERIOR REPAIR  2003   with the TAH  . BICEPT TENODESIS  11/05/2011   Procedure: BICEPS TENODESIS;  Surgeon: Hessie Dibble, MD;  Location[place];  Service: Orthopedics;  Laterality: Right;   TENODESIS  AND  ACRMIOCLAVICULAR RESECTION   . ERCP  2012  . ESOPHAGOGASTRODUODENOSCOPY (EGD) WITH PROPOFOL N/A 11/13/2017   Procedure: ESOPHAGOGASTRODUODENOSCOPY (EGD) WITH PROPOFOL;  Surgeon: Clarene Essex, MD;  Location: [place];  Service: Endoscopy;  Laterality: N/A;  . FOREIGN BODY REMOVAL  11/13/2017   Procedure: FOREIGN BODY REMOVAL;  Surgeon: Clarene Essex, MD;  Location: [facility] ENDOSCOPY;  Service: Endoscopy;;  . HARDWARE REMOVAL  2010   left ankle  . JOINT REPLACEMENT Right 2015  . KNEE ARTHROSCOPY     right and left   . ORIF ANKLE FRACTURE  2009   left  . SHOULDER ARTHROSCOPY WITH ROTATOR CUFF REPAIR AND OPEN BICEPS TENODESIS Left 11/18/2014   Procedure: SHOULDER ARTHROSCOPY WITH ROTATOR CUFF REPAIR AND OPEN BICEPS TENODESIS;  Surgeon: Melrose Nakayama, MD;  Location: [place];  Service: Orthopedics;  Laterality: Left;  . SHOULDER OPEN ROTATOR CUFF REPAIR  11/05/2011   Procedure: ROTATOR CUFF REPAIR SHOULDER OPEN;  Surgeon: Hessie Dibble, MD;  Location: [place];  Service: Orthopedics;  Laterality: Right;  RIGHT SHOULDER OPEN ROTATOR CUFF REPAIR  . TONSILLECTOMY    . TOTAL ABDOMINAL HYSTERECTOMY  2003   rt ovarian mass.  LEFT ovary remaining.  Marland Kitchen TOTAL KNEE ARTHROPLASTY Right 11/09/2013   Procedure: TOTAL KNEE ARTHROPLASTY;  Surgeon: Hessie Dibble, MD;  Location: [place];  Service: Orthopedics;  Laterality: Right;  . TOTAL KNEE ARTHROPLASTY Left 01/02/2016   Procedure: TOTAL KNEE ARTHROPLASTY;  Surgeon: Melrose Nakayama, MD;  Location: [place];  Service: Orthopedics;  Laterality: Left;  . UPPER GASTROINTESTINAL ENDOSCOPY      Social History   Tobacco Use  . Smoking status: Never Smoker  . Smokeless tobacco: Never Used  Substance Use Topics  . Alcohol use: Yes    Alcohol/week: 1.0 standard drinks    Types: 1 Shots of liquor per week    Comment: wine daily    Family History  Problem Relation Age of Onset  . Depression Mother   . Hypertension Mother   . Diabetes Father     ROS Per hpi  OBJECTIVE:  Blood pressure 116/72, pulse 94, temperature 98 F (36.7 C), temperature source Oral, resp. rate 16, height 5\' 5"  (1.651 m), weight 163 lb (73.9 kg), SpO2 98 %. Body mass index is 27.12 kg/m.   Physical Exam  Constitutional: She is oriented to person, place, and time. She appears well-developed and well-nourished.  HENT:  Head: Normocephalic and atraumatic.  Mouth/Throat: Mucous membranes are normal.  Eyes: Pupils are equal, round, and reactive to light.  Conjunctivae and EOM are normal. No scleral icterus.  Neck: Neck supple.  Pulmonary/Chest: Effort normal.  Neurological: She is alert and oriented to person, place, and time.  Skin: Skin is warm and dry.  Psychiatric: She has a normal mood and affect.  Nursing note and vitals reviewed.   ASSESSMENT and PLAN  1. Adjustment disorder with mixed anxiety and depressed mood Not controlled, more than 50% of this 25 min visit was spent regarding counseling and education. Discussed cymbalta vx xanax, r/se/b. Increasing cymbalta to 40mg  once a day  2. Hiatal hernia with GERD and esophagitis Not controlled. Discussed risk of untreated esophagitis. Encouraged taking PPI  as prescribed, following diet and keeping upcoming appt with GI - CBC  3. Screening for colon cancer - Ambulatory referral to Gastroenterology  4. Hyperlipidemia, unspecified hyperlipidemia type Discussed LFM - TSH - Comprehensive metabolic panel - Lipid panel  5. Prediabetes Discussed LFM - Comprehensive metabolic panel - Hemoglobin A1c  Other orders - DULoxetine 40 MG CPEP; Take 40 mg by mouth at bedtime. - ALPRAZolam (XANAX) 0.5 MG tablet; Take 1 tablet (0.5 mg total) by mouth at bedtime as needed for anxiety.  Return in about 3 months (around 03/03/2018).    Rutherford Guys, MD Primary [facility] at [facility] [place], [place] [address] Ph.  [phone] Fax [phone]

## 2017-12-02 LAB — COMPREHENSIVE METABOLIC PANEL
ALT: 16 IU/L (ref 0–32)
AST: 19 IU/L (ref 0–40)
Albumin/Globulin Ratio: 2.2 (ref 1.2–2.2)
Albumin: 4.7 g/dL (ref 3.6–4.8)
Alkaline Phosphatase: 111 IU/L (ref 39–117)
BUN/Creatinine Ratio: 16 (ref 12–28)
BUN: 13 mg/dL (ref 8–27)
Bilirubin Total: 0.2 mg/dL (ref 0.0–1.2)
CO2: 21 mmol/L (ref 20–29)
Calcium: 9.9 mg/dL (ref 8.7–10.3)
Chloride: 103 mmol/L (ref 96–106)
Creatinine, Ser: 0.79 mg/dL (ref 0.57–1.00)
GFR calc Af Amer: 93 mL/min/{1.73_m2} (ref >59)
GFR calc non Af Amer: 80 mL/min/{1.73_m2} (ref >59)
Globulin, Total: 2.1 g/dL (ref 1.5–4.5)
Glucose: 91 mg/dL (ref 65–99)
Potassium: 4.8 mmol/L (ref 3.5–5.2)
Sodium: 142 mmol/L (ref 134–144)
Total Protein: 6.8 g/dL (ref 6.0–8.5)

## 2017-12-02 LAB — LIPID PANEL
Chol/HDL Ratio: 4.7 ratio — ABNORMAL HIGH (ref 0.0–4.4)
Cholesterol, Total: 264 mg/dL — ABNORMAL HIGH (ref 100–199)
HDL: 56 mg/dL (ref >39)
LDL Calculated: 165 mg/dL — ABNORMAL HIGH (ref 0–99)
Triglycerides: 216 mg/dL — ABNORMAL HIGH (ref 0–149)
VLDL Cholesterol Cal: 43 mg/dL — ABNORMAL HIGH (ref 5–40)

## 2017-12-02 LAB — CBC
Hematocrit: 37.9 % (ref 34.0–46.6)
Hemoglobin: 12.6 g/dL (ref 11.1–15.9)
MCH: 28.6 pg (ref 26.6–33.0)
MCHC: 33.2 g/dL (ref 31.5–35.7)
MCV: 86 fL (ref 79–97)
Platelets: 336 10*3/uL (ref 150–450)
RBC: 4.4 x10E6/uL (ref 3.77–5.28)
RDW: 13 % (ref 12.3–15.4)
WBC: 5.2 10*3/uL (ref 3.4–10.8)

## 2017-12-02 LAB — TSH: TSH: 3.05 u[IU]/mL (ref 0.450–4.500)

## 2017-12-02 LAB — HEMOGLOBIN A1C
Est. average glucose Bld gHb Est-mCnc: 114 mg/dL
Hgb A1c MFr Bld: 5.6 % (ref 4.8–5.6)

## 2017-12-08 ENCOUNTER — Telehealth: Payer: Self-pay | Admitting: Family Medicine

## 2017-12-08 ENCOUNTER — Encounter: Payer: Self-pay | Admitting: Family Medicine

## 2017-12-08 MED ORDER — DULOXETINE HCL 60 MG PO CPEP: 60.0000 mg | ORAL_CAPSULE | Freq: Every day | ORAL | 1 refills | Status: DC

## 2017-12-08 MED FILL — DULoxetine HCL 60 MG CPEP: 60 | 90 days supply | Qty: 90 | Fill #0

## 2017-12-08 NOTE — Telephone Encounter (Signed)
Patient was denied their medication DULoxetine by their insurance company. Was given this message...   This request has not been approved. We were asked to cover the drug or product listed at the top of this letter under your pharmacy benefit. We denied this request because your plan does not allow certain drugs or classes of drugs to be covered under your pharmacy benefit. You may contact your plan for a list of excluded drugs or products. Duloxetine 40 mg capsules are excluded from coverage by your plan. The covered alternative is to take duloxetine 20 mg capsules at a dose of 2 capsules per day, which provides the requested dose of 40 mg per day. Please talk with your provider about use of the 20 mg capsules to provide the requested dose and other treatment options. A written notification letter will follow with additional details.

## 2017-12-18 ENCOUNTER — Other Ambulatory Visit: Payer: Self-pay | Admitting: Family Medicine

## 2017-12-18 ENCOUNTER — Telehealth: Payer: Self-pay | Admitting: Family Medicine

## 2017-12-18 DIAGNOSIS — J3489 Other specified disorders of nose and nasal sinuses: Principal | ICD-10-CM

## 2017-12-18 DIAGNOSIS — B958 Unspecified staphylococcus as the cause of diseases classified elsewhere: Secondary | ICD-10-CM

## 2017-12-18 MED FILL — MUPIROCIN 2% OINTMENT: 2 | 5 days supply | Qty: 22 | Fill #0

## 2017-12-18 NOTE — Telephone Encounter (Signed)
done

## 2017-12-18 NOTE — Telephone Encounter (Signed)
Requested medication (s) are due for refill today: yes  Requested medication (s) are on the active medication list: yes    Last refill: 11/12/17  Future visit scheduled no  Notes to clinic:historical provider  Requested Prescriptions  Pending Prescriptions Disp Refills   mupirocin ointment (BACTROBAN) 2 % 22 g     Sig: Place 1 application into the nose daily.     Off-Protocol Failed - 12/18/2017  1:54 PM      Failed - Medication not assigned to a protocol, review manually.      Passed - Valid encounter within last 12 months    Recent Outpatient Visits          2 weeks ago Adjustment disorder with mixed anxiety and depressed mood   [facility] at [facility], Lilia Argue, MD   5 months ago Adjustment disorder with mixed anxiety and depressed mood   Primary Care at [facility], Renette Butters, MD   1 year ago Routine physical examination   Primary Care at [facility], Renette Butters, MD   1 year ago Anxiety and depression   Primary Care at [facility], Renette Butters, MD   2 years ago Anxiety and depression   Primary Care at [facility], Renette Butters, MD

## 2017-12-18 NOTE — Telephone Encounter (Signed)
Copied from [place] [id number]. Topic: Quick Communication - Rx Refill/Question >> Dec 18, 2017  1:52 PM Ahmed Prima L wrote: Medication: mupirocin ointment (BACTROBAN) 2 %  Has the patient contacted their pharmacy? No because Dr Pamella Pert has never prescribed this. She said she has " staff " in her nose (Agent: If no, request that the patient contact the pharmacy for the refill.) (Agent: If yes, when and what did the pharmacy advise?)  Preferred Pharmacy (with phone number or street name): [facility]   Agent: Please be advised that RX refills may take up to 3 business days. We ask that you follow-up with your pharmacy.

## 2017-12-18 NOTE — Telephone Encounter (Signed)
PLEASE ADV    Copied from [place] [id number]. Topic: Referral - Request for Referral >> Dec 18, 2017  4:14 PM Percell Belt A wrote: Has patient seen PCP for this complaint? No  *If NO, is insurance requiring patient see PCP for this issue before PCP can refer them?-  pt stated she has had this before.  She has a staff infection inside her nose.  Her Demonologist told her to get a referral to ent  Referral for which specialty: [facility]  Preferred provider/office: Any  Reason for referral: Pt has staff infection in her nose

## 2018-01-02 MED FILL — ALPRAZolam 0.5 MG TABS: 0.5 | 30 days supply | Qty: 30 | Fill #1

## 2018-02-02 MED FILL — ALPRAZolam 0.5 MG TABS: 0.5 | 30 days supply | Qty: 30 | Fill #2

## 2018-02-17 ENCOUNTER — Ambulatory Visit (HOSPITAL_COMMUNITY)
Admission: EM | Admit: 2018-02-17 | Discharge: 2018-02-17 | Disposition: A | Discharge status: Home / Self Care | Payer: 59 | Attending: Family Medicine | Admitting: Family Medicine

## 2018-02-17 ENCOUNTER — Encounter (HOSPITAL_COMMUNITY): Payer: Self-pay

## 2018-02-17 DIAGNOSIS — B9789 Other viral agents as the cause of diseases classified elsewhere: Secondary | ICD-10-CM | POA: Diagnosis not present

## 2018-02-17 DIAGNOSIS — J069 Acute upper respiratory infection, unspecified: Secondary | ICD-10-CM | POA: Diagnosis not present

## 2018-02-17 MED ORDER — IPRATROPIUM-ALBUTEROL 0.5-2.5 (3) MG/3ML IN SOLN
3.0000 mL | Freq: Once | RESPIRATORY_TRACT | Status: AC
  Administered 2018-02-17: 3 mL via RESPIRATORY_TRACT

## 2018-02-17 MED ORDER — IPRATROPIUM-ALBUTEROL 0.5-2.5 (3) MG/3ML IN SOLN
RESPIRATORY_TRACT | Status: AC
  Filled 2018-02-17: qty 3

## 2018-02-17 MED ORDER — AMOXICILLIN-POT CLAVULANATE 875-125 MG PO TABS: 1.0000 | ORAL_TABLET | Freq: Two times a day (BID) | ORAL | 0 refills | Status: DC

## 2018-02-17 MED ORDER — ALBUTEROL SULFATE HFA 108 (90 BASE) MCG/ACT IN AERS: 2.0000 | INHALATION_SPRAY | Freq: Four times a day (QID) | RESPIRATORY_TRACT | 1 refills | Status: AC | PRN

## 2018-02-17 MED FILL — VENTOLIN HFA 90 MCG INHALER: 108 (90 BAS | 25 days supply | Qty: 18 | Fill #0

## 2018-02-17 MED FILL — AMOX-CLAV 875-125 MG TABLET: 875-125 | 7 days supply | Qty: 14 | Fill #0

## 2018-02-17 NOTE — ED Provider Notes (Signed)
[place]    CSN: [id number] Arrival date & time: 02/17/18  1607     History   Chief Complaint Chief Complaint  Patient presents with  . URI    HPI Lacey [name] is a 63 y.o. female.   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  She is here with an upper respiratory complaint.  She has sinus pressure and pain, more on the left than the right.  Some yellow drainage.  Headache on the left side.  No sore throat.  She has rhinorrhea and postnasal drip.  She has coughing and some chest pressure.  No shortness of breath.  No wheezing.  No chest pain.  No fever chills.  Her husband is coughing as well.  She did get a flu shot this year.  Past Medical History:  Diagnosis Date  . Anxiety   . Arthritis   . GERD (gastroesophageal reflux disease)   . Rotator cuff tear     Patient Active Problem List   Diagnosis Date Noted  . Adjustment disorder with mixed anxiety and depressed mood 07/15/2017  . Primary osteoarthritis of left knee 01/02/2016  . Right knee DJD 11/09/2013    Past Surgical History:  Procedure Laterality Date  . ANTERIOR AND POSTERIOR REPAIR  2003   with the TAH  . BICEPT TENODESIS  11/05/2011   Procedure: BICEPS TENODESIS;  Surgeon: Hessie Dibble, MD;  Location: [place];  Service: Orthopedics;  Laterality: Right;   TENODESIS  AND  ACRMIOCLAVICULAR RESECTION   . ERCP  2012  . ESOPHAGOGASTRODUODENOSCOPY (EGD) WITH PROPOFOL N/A 11/13/2017   Procedure: ESOPHAGOGASTRODUODENOSCOPY (EGD) WITH PROPOFOL;  Surgeon: Clarene Essex, MD;  Location: [place];  Service: Endoscopy;  Laterality: N/A;  . FOREIGN BODY REMOVAL  11/13/2017   Procedure: FOREIGN BODY REMOVAL;  Surgeon: Clarene Essex, MD;  Location: [facility] ENDOSCOPY;  Service: Endoscopy;;  . HARDWARE REMOVAL  2010   left ankle  . JOINT REPLACEMENT Right 2015  . KNEE ARTHROSCOPY     right and left  . ORIF ANKLE FRACTURE  2009   left  . SHOULDER ARTHROSCOPY WITH ROTATOR CUFF  REPAIR AND OPEN BICEPS TENODESIS Left 11/18/2014   Procedure: SHOULDER ARTHROSCOPY WITH ROTATOR CUFF REPAIR AND OPEN BICEPS TENODESIS;  Surgeon: Melrose Nakayama, MD;  Location: [place];  Service: Orthopedics;  Laterality: Left;  . SHOULDER OPEN ROTATOR CUFF REPAIR  11/05/2011   Procedure: ROTATOR CUFF REPAIR SHOULDER OPEN;  Surgeon: Hessie Dibble, MD;  Location: [place];  Service: Orthopedics;  Laterality: Right;  RIGHT SHOULDER OPEN ROTATOR CUFF REPAIR  . TONSILLECTOMY    . TOTAL ABDOMINAL HYSTERECTOMY  2003   rt ovarian mass.  LEFT ovary remaining.  Marland Kitchen TOTAL KNEE ARTHROPLASTY Right 11/09/2013   Procedure: TOTAL KNEE ARTHROPLASTY;  Surgeon: Hessie Dibble, MD;  Location: [place];  Service: Orthopedics;  Laterality: Right;  . TOTAL KNEE ARTHROPLASTY Left 01/02/2016   Procedure: TOTAL KNEE ARTHROPLASTY;  Surgeon: Melrose Nakayama, MD;  Location: [place];  Service: Orthopedics;  Laterality: Left;  . UPPER GASTROINTESTINAL ENDOSCOPY      OB History   No obstetric history on file.      Home Medications    Prior to Admission medications   Medication Sig Start Date End Date Taking? Authorizing Provider  albuterol (PROVENTIL HFA;VENTOLIN HFA) 108 (90 Base) MCG/ACT inhaler Inhale 2 puffs into the lungs every 6 (six) hours as needed for wheezing or shortness of  breath. 02/17/18   Raylene Everts, MD  ALPRAZolam Duanne Moron) 0.5 MG tablet Take 1 tablet (0.5 mg total) by mouth at bedtime as needed for anxiety. 12/01/17   Rutherford Guys, MD  amoxicillin-clavulanate (AUGMENTIN) 875-125 MG tablet Take 1 tablet by mouth every 12 (twelve) hours. 02/17/18   Raylene Everts, MD  B Complex-Folic Acid (B COMPLEX-VITAMIN B12 PO) Take 1 tablet by mouth daily.    [provider]  cycloSPORINE (RESTASIS) 0.05 % ophthalmic emulsion Place 1 drop into both eyes daily.     [provider]  diphenhydrAMINE (BENADRYL) 25 MG tablet Take 50 mg by mouth at bedtime as  needed for sleep.     [provider]  DULoxetine (CYMBALTA) 60 MG capsule Take 1 capsule (60 mg total) by mouth daily. 12/08/17   Rutherford Guys, MD  ibuprofen (ADVIL,MOTRIN) 200 MG tablet Take 200-400 mg by mouth 2 (two) times daily as needed (for pain.).    [provider]  mupirocin ointment (BACTROBAN) 2 % Place 1 application into the nose daily.    [provider]  vitamin E 1000 UNIT capsule Take 1,000 Units by mouth daily.    [provider]    Family History Family History  Problem Relation Age of Onset  . Depression Mother   . Hypertension Mother   . Diabetes Father     Social History Social History   Tobacco Use  . Smoking status: Never Smoker  . Smokeless tobacco: Never Used  Substance Use Topics  . Alcohol use: Yes    Alcohol/week: 1.0 standard drinks    Types: 1 Shots of liquor per week    Comment: wine daily  . Drug use: No     Allergies   Morphine and related   Review of Systems Review of Systems  Constitutional: Positive for fatigue. Negative for chills and fever.  HENT: Positive for congestion, postnasal drip, rhinorrhea, sinus pressure and sinus pain. Negative for ear pain and sore throat.   Eyes: Negative for pain and visual disturbance.  Respiratory: Positive for cough and chest tightness. Negative for shortness of breath.   Cardiovascular: Negative for chest pain and palpitations.  Gastrointestinal: Negative for abdominal pain and vomiting.  Genitourinary: Negative for dysuria and hematuria.  Musculoskeletal: Negative for arthralgias and back pain.  Skin: Negative for color change and rash.  Neurological: Negative for seizures and syncope.  All other systems reviewed and are negative.    Physical Exam Triage Vital Signs ED Triage Vitals  Enc Vitals Group     BP 02/17/18 0844 (!) 143/91     Pulse Rate 02/17/18 0844 82     Resp 02/17/18 0844 18     Temp 02/17/18 0844 98.4 F (36.9 C)     Temp Source  02/17/18 0844 Oral     SpO2 02/17/18 0844 100 %     Weight --      Height --      Head Circumference --      Peak Flow --      Pain Score 02/17/18 0856 5     Pain Loc --      Pain Edu? --      Excl. in [place]? --    No data found.  Updated Vital Signs BP (!) 143/91 (BP Location: Left Arm)   Pulse 82   Temp 98.4 F (36.9 C) (Oral)   Resp 18   SpO2 100%   Visual Acuity Right Eye Distance:  Left Eye Distance:   Bilateral Distance:    Right Eye Near:   Left Eye Near:    Bilateral Near:     Physical Exam Constitutional:      General: She is not in acute distress.    Appearance: She is well-developed and normal weight.  HENT:     Head: Normocephalic and atraumatic.     Comments: Tenderness over the left frontal and ethmoid sinuses    Right Ear: Tympanic membrane, ear canal and external ear normal.     Left Ear: Tympanic membrane, ear canal and external ear normal.     Nose: Congestion and rhinorrhea present.     Mouth/Throat:     Pharynx: Posterior oropharyngeal erythema present.  Eyes:     Conjunctiva/sclera: Conjunctivae normal.     Pupils: Pupils are equal, round, and reactive to light.  Neck:     Musculoskeletal: Normal range of motion and neck supple.  Cardiovascular:     Rate and Rhythm: Normal rate and regular rhythm.     Heart sounds: Normal heart sounds.  Pulmonary:     Effort: Pulmonary effort is normal. No respiratory distress.     Breath sounds: Normal breath sounds.  Abdominal:     General: There is no distension.     Palpations: Abdomen is soft.  Musculoskeletal: Normal range of motion.  Lymphadenopathy:     Cervical: Cervical adenopathy present.  Skin:    General: Skin is warm and dry.  Neurological:     General: No focal deficit present.     Mental Status: She is alert. Mental status is at baseline.  Psychiatric:        Mood and Affect: Mood normal.        Thought Content: Thought content normal.      UC Treatments / Results  Labs (all  labs ordered are listed, but only abnormal results are displayed) Labs Reviewed - No data to display  EKG None  Radiology No results found.  Procedures Procedures (including critical care time)  Medications Ordered in UC Medications  ipratropium-albuterol (DUONEB) 0.5-2.5 (3) MG/3ML nebulizer solution 3 mL (3 mLs Nebulization Given 02/17/18 0925)    Initial Impression / Assessment and Plan / UC Course  I have reviewed the triage vital signs and the nursing notes.  Pertinent labs & imaging results that were available during my care of the patient were reviewed by me and considered in my medical decision making (see chart for details).     We reviewed that most sinus infections are caused by viruses.  I recommend she continue with symptomatic care including Flonase, Mucinex DM, and pushing fluids with Tylenol or ibuprofen for her pain.  If she fails to improve over a week's time, or starts having worsening symptoms with fever then an antibiotic would be indicated.  She is given a printed prescription for Augmentin to take if she meets these criteria. Final Clinical Impressions(s) / UC Diagnoses   Final diagnoses:  Viral URI with cough     Discharge Instructions     Mucinex DM and flonase are good to help control symptoms Drink plenty of fluids Fill and  use the antibiotic if you fail to see improvement after a week, or if your symptoms worsen   ED Prescriptions    Medication Sig Dispense Auth. Provider   amoxicillin-clavulanate (AUGMENTIN) 875-125 MG tablet Take 1 tablet by mouth every 12 (twelve) hours. 14 tablet Raylene Everts, MD   albuterol (PROVENTIL HFA;VENTOLIN HFA) 108 (  90 Base) MCG/ACT inhaler Inhale 2 puffs into the lungs every 6 (six) hours as needed for wheezing or shortness of breath. 1 Inhaler Raylene Everts, MD     Controlled Substance Prescriptions [place] Controlled Substance Registry consulted? Not Applicable   Raylene Everts, MD 02/17/18 1002

## 2018-02-17 NOTE — ED Triage Notes (Signed)
Pt presents with headache, congestion, nasal drainage, and cough X 4 days.  Pt states she has a history of pneumonia and breathing issues.

## 2018-02-17 NOTE — Discharge Instructions (Addendum)
Mucinex DM and flonase are good to help control symptoms Drink plenty of fluids Fill and  use the antibiotic if you fail to see improvement after a week, or if your symptoms worsen

## 2018-03-19 ENCOUNTER — Telehealth: Payer: Self-pay | Admitting: Family Medicine

## 2018-03-19 NOTE — Telephone Encounter (Signed)
Copied from [place] [phone]. Topic: General - Other >> Mar 18, 2018  4:07 PM Ivar Drape wrote: Reason for CRM:   Patient would like a whooping cough booster injection

## 2018-03-20 NOTE — Telephone Encounter (Signed)
Informed pt that she could come into the office for tdap vaccine as a nurse visit.

## 2018-03-23 ENCOUNTER — Ambulatory Visit (INDEPENDENT_AMBULATORY_CARE_PROVIDER_SITE_OTHER): Payer: 59 | Admitting: Family Medicine

## 2018-03-23 ENCOUNTER — Telehealth: Payer: Self-pay | Admitting: Family Medicine

## 2018-03-23 DIAGNOSIS — Z23 Encounter for immunization: Secondary | ICD-10-CM

## 2018-03-23 NOTE — Patient Instructions (Signed)
Here for tdap

## 2018-03-23 NOTE — Telephone Encounter (Signed)
Patient would like a refill of her medication Cymbalta. Please advise.

## 2018-03-23 NOTE — Telephone Encounter (Signed)
Pt has an appointment today 04/03/2018 and will go over medications and refills.

## 2018-04-06 ENCOUNTER — Encounter: Payer: Self-pay | Admitting: Emergency Medicine

## 2018-04-06 ENCOUNTER — Ambulatory Visit: Payer: 59 | Admitting: Emergency Medicine

## 2018-04-06 ENCOUNTER — Other Ambulatory Visit: Payer: Self-pay

## 2018-04-06 VITALS — BP 122/70 | HR 94 | Temp 97.9°F | Resp 18 | Ht 65.0 in | Wt 169.4 lb

## 2018-04-06 DIAGNOSIS — F4323 Adjustment disorder with mixed anxiety and depressed mood: Secondary | ICD-10-CM | POA: Diagnosis not present

## 2018-04-06 DIAGNOSIS — K529 Noninfective gastroenteritis and colitis, unspecified: Secondary | ICD-10-CM

## 2018-04-06 MED ORDER — DULOXETINE HCL 60 MG PO CPEP: 60.0000 mg | ORAL_CAPSULE | Freq: Every day | ORAL | 3 refills | Status: DC

## 2018-04-06 NOTE — Patient Instructions (Addendum)
If you have lab work done today you will be contacted with your lab results within the next 2 weeks.  If you have not heard from [place] then please contact us. The fastest way to get your results is to register for My Chart.   IF you received an x-ray today, you will receive an invoice from [facility]. Please contact [facility] at [phone] with questions or concerns regarding your invoice.   IF you received labwork today, you will receive an invoice from [place]. Please contact [facility] at [phone] with questions or concerns regarding your invoice.   Our billing staff will not be able to assist you with questions regarding bills from these companies.  You will be contacted with the lab results as soon as they are available. The fastest way to get your results is to activate your My Chart account. Instructions are located on the last page of this paperwork. If you have not heard from [place] regarding the results in 2 weeks, please contact this office.    Health Maintenance, Female Adopting a healthy lifestyle and getting preventive care can go a long way to promote health and wellness. Talk with your health care provider about what schedule of regular examinations is right for you. This is a good chance for you to check in with your provider about disease prevention and staying healthy. In between checkups, there are plenty of things you can do on your own. Experts have done a lot of research about which lifestyle changes and preventive measures are most likely to keep you healthy. Ask your health care provider for more information. Weight and diet Eat a healthy diet  Be sure to include plenty of vegetables, fruits, low-fat dairy products, and lean protein.  Do not eat a lot of foods high in solid fats, added sugars, or salt.  Get regular exercise. This is one of the most important things you can do for your health. ? Most adults should exercise for at least 150  minutes each week. The exercise should increase your heart rate and make you sweat (moderate-intensity exercise). ? Most adults should also do strengthening exercises at least twice a week. This is in addition to the moderate-intensity exercise. Maintain a healthy weight  Body mass index (BMI) is a measurement that can be used to identify possible weight problems. It estimates body fat based on height and weight. Your health care provider can help determine your BMI and help you achieve or maintain a healthy weight.  For females 12 years of age and older: ? A BMI below 18.5 is considered underweight. ? A BMI of 18.5 to 24.9 is normal. ? A BMI of 25 to 29.9 is considered overweight. ? A BMI of 30 and above is considered obese. Watch levels of cholesterol and blood lipids  You should start having your blood tested for lipids and cholesterol at 63 years of age, then have this test every 5 years.  You may need to have your cholesterol levels checked more often if: ? Your lipid or cholesterol levels are high. ? You are older than 63 years of age. ? You are at high risk for heart disease. Cancer screening Lung Cancer  Lung cancer screening is recommended for adults 58-36 years old who are at high risk for lung cancer because of a history of smoking.  A yearly low-dose CT scan of the lungs is recommended for people who: ? Currently smoke. ? Have quit within the past 15  years. ? Have at least a 30-pack-year history of smoking. A pack year is smoking an average of one pack of cigarettes a day for 1 year.  Yearly screening should continue until it has been 15 years since you quit.  Yearly screening should stop if you develop a health problem that would prevent you from having lung cancer treatment. Breast Cancer  Practice breast self-awareness. This means understanding how your breasts normally appear and feel.  It also means doing regular breast self-exams. Let your health care provider  know about any changes, no matter how small.  If you are in your 20s or 30s, you should have a clinical breast exam (CBE) by a health care provider every 1-3 years as part of a regular health exam.  If you are 3 or older, have a CBE every year. Also consider having a breast X-ray (mammogram) every year.  If you have a family history of breast cancer, talk to your health care provider about genetic screening.  If you are at high risk for breast cancer, talk to your health care provider about having an MRI and a mammogram every year.  Breast cancer gene (BRCA) assessment is recommended for women who have family members with BRCA-related cancers. BRCA-related cancers include: ? Breast. ? Ovarian. ? Tubal. ? Peritoneal cancers.  Results of the assessment will determine the need for genetic counseling and BRCA1 and BRCA2 testing. Cervical Cancer Your health care provider may recommend that you be screened regularly for cancer of the pelvic organs (ovaries, uterus, and vagina). This screening involves a pelvic examination, including checking for microscopic changes to the surface of your cervix (Pap test). You may be encouraged to have this screening done every 3 years, beginning at age 63.  For women ages 71-65, health care providers may recommend pelvic exams and Pap testing every 3 years, or they may recommend the Pap and pelvic exam, combined with testing for human papilloma virus (HPV), every 5 years. Some types of HPV increase your risk of cervical cancer. Testing for HPV may also be done on women of any age with unclear Pap test results.  Other health care providers may not recommend any screening for nonpregnant women who are considered low risk for pelvic cancer and who do not have symptoms. Ask your health care provider if a screening pelvic exam is right for you.  If you have had past treatment for cervical cancer or a condition that could lead to cancer, you need Pap tests and  screening for cancer for at least 20 years after your treatment. If Pap tests have been discontinued, your risk factors (such as having a new sexual partner) need to be reassessed to determine if screening should resume. Some women have medical problems that increase the chance of getting cervical cancer. In these cases, your health care provider may recommend more frequent screening and Pap tests. Colorectal Cancer  This type of cancer can be detected and often prevented.  Routine colorectal cancer screening usually begins at 63 years of age and continues through 63 years of age.  Your health care provider may recommend screening at an earlier age if you have risk factors for colon cancer.  Your health care provider may also recommend using home test kits to check for hidden blood in the stool.  A small camera at the end of a tube can be used to examine your colon directly (sigmoidoscopy or colonoscopy). This is done to check for the earliest forms of colorectal cancer.  Routine screening usually begins at age 50.  Direct examination of the colon should be repeated every 5-10 years through 63 years of age. However, you may need to be screened more often if early forms of precancerous polyps or small growths are found. Skin Cancer  Check your skin from head to toe regularly.  Tell your health care provider about any new moles or changes in moles, especially if there is a change in a mole's shape or color.  Also tell your health care provider if you have a mole that is larger than the size of a pencil eraser.  Always use sunscreen. Apply sunscreen liberally and repeatedly throughout the day.  Protect yourself by wearing long sleeves, pants, a wide-brimmed hat, and sunglasses whenever you are outside. Heart disease, diabetes, and high blood pressure  High blood pressure causes heart disease and increases the risk of stroke. High blood pressure is more likely to develop in: ? People who  have blood pressure in the high end of the normal range (130-139/85-89 mm Hg). ? People who are overweight or obese. ? People who are African American.  If you are 18-39 years of age, have your blood pressure checked every 3-5 years. If you are 40 years of age or older, have your blood pressure checked every year. You should have your blood pressure measured twice-once when you are at a hospital or clinic, and once when you are not at a hospital or clinic. Record the average of the two measurements. To check your blood pressure when you are not at a hospital or clinic, you can use: ? An automated blood pressure machine at a pharmacy. ? A home blood pressure monitor.  If you are between 55 years and 79 years old, ask your health care provider if you should take aspirin to prevent strokes.  Have regular diabetes screenings. This involves taking a blood sample to check your fasting blood sugar level. ? If you are at a normal weight and have a low risk for diabetes, have this test once every three years after 63 years of age. ? If you are overweight and have a high risk for diabetes, consider being tested at a younger age or more often. Preventing infection Hepatitis B  If you have a higher risk for hepatitis B, you should be screened for this virus. You are considered at high risk for hepatitis B if: ? You were born in a country where hepatitis B is common. Ask your health care provider which countries are considered high risk. ? Your parents were born in a high-risk country, and you have not been immunized against hepatitis B (hepatitis B vaccine). ? You have HIV or AIDS. ? You use needles to inject street drugs. ? You live with someone who has hepatitis B. ? You have had sex with someone who has hepatitis B. ? You get hemodialysis treatment. ? You take certain medicines for conditions, including cancer, organ transplantation, and autoimmune conditions. Hepatitis C  Blood testing is  recommended for: ? Everyone born from 1945 through 1965. ? Anyone with known risk factors for hepatitis C. Sexually transmitted infections (STIs)  You should be screened for sexually transmitted infections (STIs) including gonorrhea and chlamydia if: ? You are sexually active and are younger than 63 years of age. ? You are older than 63 years of age and your health care provider tells you that you are at risk for this type of infection. ? Your sexual activity has changed since you   were last screened and you are at an increased risk for chlamydia or gonorrhea. Ask your health care provider if you are at risk.  If you do not have HIV, but are at risk, it may be recommended that you take a prescription medicine daily to prevent HIV infection. This is called pre-exposure prophylaxis (PrEP). You are considered at risk if: ? You are sexually active and do not regularly use condoms or know the HIV status of your partner(s). ? You take drugs by injection. ? You are sexually active with a partner who has HIV. Talk with your health care provider about whether you are at high risk of being infected with HIV. If you choose to begin PrEP, you should first be tested for HIV. You should then be tested every 3 months for as long as you are taking PrEP. Pregnancy  If you are premenopausal and you may become pregnant, ask your health care provider about preconception counseling.  If you may become pregnant, take 400 to 800 micrograms (mcg) of folic acid every day.  If you want to prevent pregnancy, talk to your health care provider about birth control (contraception). Osteoporosis and menopause  Osteoporosis is a disease in which the bones lose minerals and strength with aging. This can result in serious bone fractures. Your risk for osteoporosis can be identified using a bone density scan.  If you are 65 years of age or older, or if you are at risk for osteoporosis and fractures, ask your health care  provider if you should be screened.  Ask your health care provider whether you should take a calcium or vitamin D supplement to lower your risk for osteoporosis.  Menopause may have certain physical symptoms and risks.  Hormone replacement therapy may reduce some of these symptoms and risks. Talk to your health care provider about whether hormone replacement therapy is right for you. Follow these instructions at home:  Schedule regular health, dental, and eye exams.  Stay current with your immunizations.  Do not use any tobacco products including cigarettes, chewing tobacco, or electronic cigarettes.  If you are pregnant, do not drink alcohol.  If you are breastfeeding, limit how much and how often you drink alcohol.  Limit alcohol intake to no more than 1 drink per day for nonpregnant women. One drink equals 12 ounces of beer, 5 ounces of wine, or 1 ounces of hard liquor.  Do not use street drugs.  Do not share needles.  Ask your health care provider for help if you need support or information about quitting drugs.  Tell your health care provider if you often feel depressed.  Tell your health care provider if you have ever been abused or do not feel safe at home. This information is not intended to replace advice given to you by your health care provider. Make sure you discuss any questions you have with your health care provider. Document Released: 07/16/2010 Document Revised: 06/08/2015 Document Reviewed: 10/04/2014 [employer] Interactive Patient Education  2019 [employer]  

## 2018-04-06 NOTE — Progress Notes (Signed)
Lacey [name] 63 y.o.   Chief Complaint  Patient presents with  . Diarrhea    needs note saying she is good to go back to work   . Nausea    was last week   . Medication Refill    cymbalta     HISTORY OF PRESENT ILLNESS: This is a 63 y.o. female was sick with a diarrhea and nausea and vomiting last week for the past 3 to 4 days.  Feels better today and ready to go back to work next Wednesday.  Needs a work note. Also needs refill on Cymbalta 60 mg daily. No other complaints or medical concerns at this time.  HPI   Prior to Admission medications   Medication Sig Start Date End Date Taking? Authorizing Provider  ALPRAZolam Duanne Moron) 0.5 MG tablet Take 1 tablet (0.5 mg total) by mouth at bedtime as needed for anxiety. 12/01/17  Yes Rutherford Guys, MD  B Complex-Folic Acid (B COMPLEX-VITAMIN B12 PO) Take 1 tablet by mouth daily.   Yes [provider]  cycloSPORINE (RESTASIS) 0.05 % ophthalmic emulsion Place 1 drop into both eyes daily.    Yes [provider]  diphenhydrAMINE (BENADRYL) 25 MG tablet Take 50 mg by mouth at bedtime as needed for sleep.    Yes [provider]  DULoxetine (CYMBALTA) 60 MG capsule Take 1 capsule (60 mg total) by mouth daily. 04/06/18 07/05/18 Yes Anila Bojarski, Ines Bloomer, MD  ibuprofen (ADVIL,MOTRIN) 200 MG tablet Take 200-400 mg by mouth 2 (two) times daily as needed (for pain.).   Yes [provider]  mupirocin ointment (BACTROBAN) 2 % Place 1 application into the nose daily.   Yes [provider]  vitamin E 1000 UNIT capsule Take 1,000 Units by mouth daily.   Yes [provider]  albuterol (PROVENTIL HFA;VENTOLIN HFA) 108 (90 Base) MCG/ACT inhaler Inhale 2 puffs into the lungs every 6 (six) hours as needed for wheezing or shortness of breath. Patient not taking: Reported on 04/06/2018 02/17/18   Raylene Everts, MD    Allergies  Allergen Reactions  . Morphine And Related Other (See Comments)    "feels  crazy"    Patient Active Problem List   Diagnosis Date Noted  . Adjustment disorder with mixed anxiety and depressed mood 07/15/2017  . Primary osteoarthritis of left knee 01/02/2016  . 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 Anxiety   . Arthritis   . GERD (gastroesophageal reflux disease)   . Rotator cuff tear     Past Surgical History:  Procedure Laterality Date  . ANTERIOR AND POSTERIOR REPAIR  2003   with the TAH  . BICEPT TENODESIS  11/05/2011   Procedure: BICEPS TENODESIS;  Surgeon: Hessie Dibble, MD;  Location: [place];  Service: Orthopedics;  Laterality: Right;   TENODESIS  AND  ACRMIOCLAVICULAR RESECTION   . ERCP  2012  . ESOPHAGOGASTRODUODENOSCOPY (EGD) WITH PROPOFOL N/A 11/13/2017   Procedure: ESOPHAGOGASTRODUODENOSCOPY (EGD) WITH PROPOFOL;  Surgeon: Clarene Essex, MD;  Location: [place];  Service: Endoscopy;  Laterality: N/A;  . FOREIGN BODY REMOVAL  11/13/2017   Procedure: FOREIGN BODY REMOVAL;  Surgeon: Clarene Essex, MD;  Location: [facility] ENDOSCOPY;  Service: Endoscopy;;  . HARDWARE REMOVAL  2010   left ankle  . JOINT REPLACEMENT Right 2015  . KNEE ARTHROSCOPY     right and left  . ORIF ANKLE FRACTURE  2009   left  .  SHOULDER ARTHROSCOPY WITH ROTATOR CUFF REPAIR AND OPEN BICEPS TENODESIS Left 11/18/2014   Procedure: SHOULDER ARTHROSCOPY WITH ROTATOR CUFF REPAIR AND OPEN BICEPS TENODESIS;  Surgeon: Melrose Nakayama, MD;  Location: [facility];  Service: Orthopedics;  Laterality: Left;  . SHOULDER OPEN ROTATOR CUFF REPAIR  11/05/2011   Procedure: ROTATOR CUFF REPAIR SHOULDER OPEN;  Surgeon: Hessie Dibble, MD;  Location: [place];  Service: Orthopedics;  Laterality: Right;  RIGHT SHOULDER OPEN ROTATOR CUFF REPAIR  . TONSILLECTOMY    . TOTAL ABDOMINAL HYSTERECTOMY  2003   rt ovarian mass.  LEFT ovary remaining.  Marland Kitchen TOTAL KNEE ARTHROPLASTY Right 11/09/2013   Procedure: TOTAL KNEE  ARTHROPLASTY;  Surgeon: Hessie Dibble, MD;  Location: [place];  Service: Orthopedics;  Laterality: Right;  . TOTAL KNEE ARTHROPLASTY Left 01/02/2016   Procedure: TOTAL KNEE ARTHROPLASTY;  Surgeon: Melrose Nakayama, MD;  Location: [place];  Service: Orthopedics;  Laterality: Left;  . UPPER GASTROINTESTINAL ENDOSCOPY      Social History   Socioeconomic History  . Marital status: Married    Spouse name: Not on file  . Number of children: Not on file  . Years of education: Not on file  . Highest education level: Not on file  Occupational History  . Not on file  Social Needs  . Financial resource strain: Not on file  . Food insecurity:    Worry: Not on file    Inability: Not on file  . Transportation needs:    Medical: Not on file    Non-medical: Not on file  Tobacco Use  . Smoking status: Never Smoker  . Smokeless tobacco: Never Used  Substance and Sexual Activity  . Alcohol use: Yes    Alcohol/week: 1.0 standard drinks    Types: 1 Shots of liquor per week    Comment: wine daily  . Drug use: No  . Sexual activity: Yes    Birth control/protection: Surgical  Lifestyle  . Physical activity:    Days per week: Not on file    Minutes per session: Not on file  . Stress: Not on file  Relationships  . Social connections:    Talks on phone: Not on file    Gets together: Not on file    Attends religious service: Not on file    Active member of club or organization: Not on file    Attends meetings of clubs or organizations: Not on file    Relationship status: Not on file  . Intimate partner violence:    Fear of current or ex partner: Not on file    Emotionally abused: Not on file    Physically abused: Not on file    Forced sexual activity: Not on file  Other Topics Concern  . Not on file  Social History Narrative   Marital status: married      Children: 2 children; no grandchildren      Employment: Therapist, music for [employer]      Lives: with husband      Tobacco: none       Alcohol: none      Exercise:           Family History  Problem Relation Age of Onset  . Depression Mother   . Hypertension Mother   . Diabetes Father      Review of Systems  Constitutional: Negative.  Negative for chills and fever.  HENT: Negative.  Negative for hearing loss.   Eyes: Negative.  Negative for blurred vision and double vision.  Respiratory: Negative.  Negative for cough and shortness of breath.   Cardiovascular: Negative.  Negative for chest pain and palpitations.  Gastrointestinal: Negative for abdominal pain, nausea and vomiting.       GI symptoms resolved  Genitourinary: Negative.   Skin: Negative.  Negative for rash.  Neurological: Negative for dizziness and headaches.  All other systems reviewed and are negative.   Vitals:   04/06/18 1620  BP: 122/70  Pulse: 94  Resp: 18  Temp: 97.9 F (36.6 C)  SpO2: 96%    Physical Exam Vitals signs reviewed.  Constitutional:      Appearance: Normal appearance.  HENT:     Head: Normocephalic.  Eyes:     Extraocular Movements: Extraocular movements intact.     Pupils: Pupils are equal, round, and reactive to light.  Neck:     Musculoskeletal: Normal range of motion and neck supple.  Cardiovascular:     Rate and Rhythm: Normal rate and regular rhythm.     Heart sounds: Normal heart sounds.  Pulmonary:     Effort: Pulmonary effort is normal.     Breath sounds: Normal breath sounds.  Musculoskeletal: Normal range of motion.  Skin:    General: Skin is warm and dry.     Capillary Refill: Capillary refill takes less than 2 seconds.  Neurological:     General: No focal deficit present.     Mental Status: She is alert and oriented to person, place, and time.  Psychiatric:        Mood and Affect: Mood normal.        Behavior: Behavior normal.      ASSESSMENT & PLAN: Nayzeth was seen today for diarrhea, nausea and medication refill.  Diagnoses and all orders for this visit:  Adjustment disorder with  mixed anxiety and depressed mood -     DULoxetine (CYMBALTA) 60 MG capsule; Take 1 capsule (60 mg total) by mouth daily.  Acute gastroenteritis Comments: Resolved   Patient Instructions       If you have lab work done today you will be contacted with your lab results within the next 2 weeks.  If you have not heard from [place] then please contact us. The fastest way to get your results is to register for My Chart.   IF you received an x-ray today, you will receive an invoice from [facility]. Please contact [facility] at [phone] with questions or concerns regarding your invoice.   IF you received labwork today, you will receive an invoice from [facility]. Please contact [facility] at [phone] with questions or concerns regarding your invoice.   Our billing staff will not be able to assist you with questions regarding bills from these companies.  You will be contacted with the lab results as soon as they are available. The fastest way to get your results is to activate your My Chart account. Instructions are located on the last page of this paperwork. If you have not heard from [place] regarding the results in 2 weeks, please contact this office.    Health Maintenance, Female Adopting a healthy lifestyle and getting preventive care can go a long way to promote health and wellness. Talk with your health care provider about what schedule of regular examinations is right for you. This is a good chance for you to check in with your provider about disease prevention and staying healthy. In between checkups, there are plenty of things you can  do on your own. Experts have done a lot of research about which lifestyle changes and preventive measures are most likely to keep you healthy. Ask your health care provider for more information. Weight and diet Eat a healthy diet  Be sure to include plenty of vegetables, fruits, low-fat dairy products, and lean protein.  Do not eat a lot  of foods high in solid fats, added sugars, or salt.  Get regular exercise. This is one of the most important things you can do for your health. ? Most adults should exercise for at least 150 minutes each week. The exercise should increase your heart rate and make you sweat (moderate-intensity exercise). ? Most adults should also do strengthening exercises at least twice a week. This is in addition to the moderate-intensity exercise. Maintain a healthy weight  Body mass index (BMI) is a measurement that can be used to identify possible weight problems. It estimates body fat based on height and weight. Your health care provider can help determine your BMI and help you achieve or maintain a healthy weight.  For females 32 years of age and older: ? A BMI below 18.5 is considered underweight. ? A BMI of 18.5 to 24.9 is normal. ? A BMI of 25 to 29.9 is considered overweight. ? A BMI of 30 and above is considered obese. Watch levels of cholesterol and blood lipids  You should start having your blood tested for lipids and cholesterol at 63 years of age, then have this test every 5 years.  You may need to have your cholesterol levels checked more often if: ? Your lipid or cholesterol levels are high. ? You are older than 63 years of age. ? You are at high risk for heart disease. Cancer screening Lung Cancer  Lung cancer screening is recommended for adults 12-64 years old who are at high risk for lung cancer because of a history of smoking.  A yearly low-dose CT scan of the lungs is recommended for people who: ? Currently smoke. ? Have quit within the past 15 years. ? Have at least a 30-pack-year history of smoking. A pack year is smoking an average of one pack of cigarettes a day for 1 year.  Yearly screening should continue until it has been 15 years since you quit.  Yearly screening should stop if you develop a health problem that would prevent you from having lung cancer  treatment. Breast Cancer  Practice breast self-awareness. This means understanding how your breasts normally appear and feel.  It also means doing regular breast self-exams. Let your health care provider know about any changes, no matter how small.  If you are in your 20s or 30s, you should have a clinical breast exam (CBE) by a health care provider every 1-3 years as part of a regular health exam.  If you are 39 or older, have a CBE every year. Also consider having a breast X-ray (mammogram) every year.  If you have a family history of breast cancer, talk to your health care provider about genetic screening.  If you are at high risk for breast cancer, talk to your health care provider about having an MRI and a mammogram every year.  Breast cancer gene (BRCA) assessment is recommended for women who have family members with BRCA-related cancers. BRCA-related cancers include: ? Breast. ? Ovarian. ? Tubal. ? Peritoneal cancers.  Results of the assessment will determine the need for genetic counseling and BRCA1 and BRCA2 testing. Cervical Cancer Your health care  provider may recommend that you be screened regularly for cancer of the pelvic organs (ovaries, uterus, and vagina). This screening involves a pelvic examination, including checking for microscopic changes to the surface of your cervix (Pap test). You may be encouraged to have this screening done every 3 years, beginning at age 70.  For women ages 81-65, health care providers may recommend pelvic exams and Pap testing every 3 years, or they may recommend the Pap and pelvic exam, combined with testing for human papilloma virus (HPV), every 5 years. Some types of HPV increase your risk of cervical cancer. Testing for HPV may also be done on women of any age with unclear Pap test results.  Other health care providers may not recommend any screening for nonpregnant women who are considered low risk for pelvic cancer and who do not have  symptoms. Ask your health care provider if a screening pelvic exam is right for you.  If you have had past treatment for cervical cancer or a condition that could lead to cancer, you need Pap tests and screening for cancer for at least 20 years after your treatment. If Pap tests have been discontinued, your risk factors (such as having a new sexual partner) need to be reassessed to determine if screening should resume. Some women have medical problems that increase the chance of getting cervical cancer. In these cases, your health care provider may recommend more frequent screening and Pap tests. Colorectal Cancer  This type of cancer can be detected and often prevented.  Routine colorectal cancer screening usually begins at 63 years of age and continues through 63 years of age.  Your health care provider may recommend screening at an earlier age if you have risk factors for colon cancer.  Your health care provider may also recommend using home test kits to check for hidden blood in the stool.  A small camera at the end of a tube can be used to examine your colon directly (sigmoidoscopy or colonoscopy). This is done to check for the earliest forms of colorectal cancer.  Routine screening usually begins at age 52.  Direct examination of the colon should be repeated every 5-10 years through 63 years of age. However, you may need to be screened more often if early forms of precancerous polyps or small growths are found. Skin Cancer  Check your skin from head to toe regularly.  Tell your health care provider about any new moles or changes in moles, especially if there is a change in a mole's shape or color.  Also tell your health care provider if you have a mole that is larger than the size of a pencil eraser.  Always use sunscreen. Apply sunscreen liberally and repeatedly throughout the day.  Protect yourself by wearing long sleeves, pants, a wide-brimmed hat, and sunglasses whenever you are  outside. Heart disease, diabetes, and high blood pressure  High blood pressure causes heart disease and increases the risk of stroke. High blood pressure is more likely to develop in: ? People who have blood pressure in the high end of the normal range (130-139/85-89 mm Hg). ? People who are overweight or obese. ? People who are African American.  If you are 71-29 years of age, have your blood pressure checked every 3-5 years. If you are 13 years of age or older, have your blood pressure checked every year. You should have your blood pressure measured twice-once when you are at a hospital or clinic, and once when you are not  at a hospital or clinic. Record the average of the two measurements. To check your blood pressure when you are not at a hospital or clinic, you can use: ? An automated blood pressure machine at a pharmacy. ? A home blood pressure monitor.  If you are between 30 years and 65 years old, ask your health care provider if you should take aspirin to prevent strokes.  Have regular diabetes screenings. This involves taking a blood sample to check your fasting blood sugar level. ? If you are at a normal weight and have a low risk for diabetes, have this test once every three years after 63 years of age. ? If you are overweight and have a high risk for diabetes, consider being tested at a younger age or more often. Preventing infection Hepatitis B  If you have a higher risk for hepatitis B, you should be screened for this virus. You are considered at high risk for hepatitis B if: ? You were born in a country where hepatitis B is common. Ask your health care provider which countries are considered high risk. ? Your parents were born in a high-risk country, and you have not been immunized against hepatitis B (hepatitis B vaccine). ? You have HIV or AIDS. ? You use needles to inject street drugs. ? You live with someone who has hepatitis B. ? You have had sex with someone who has  hepatitis B. ? You get hemodialysis treatment. ? You take certain medicines for conditions, including cancer, organ transplantation, and autoimmune conditions. Hepatitis C  Blood testing is recommended for: ? Everyone born from [birth year] through 1965. ? Anyone with known risk factors for hepatitis C. Sexually transmitted infections (STIs)  You should be screened for sexually transmitted infections (STIs) including gonorrhea and chlamydia if: ? You are sexually active and are younger than 63 years of age. ? You are older than 63 years of age and your health care provider tells you that you are at risk for this type of infection. ? Your sexual activity has changed since you were last screened and you are at an increased risk for chlamydia or gonorrhea. Ask your health care provider if you are at risk.  If you do not have HIV, but are at risk, it may be recommended that you take a prescription medicine daily to prevent HIV infection. This is called pre-exposure prophylaxis (PrEP). You are considered at risk if: ? You are sexually active and do not regularly use condoms or know the HIV status of your partner(s). ? You take drugs by injection. ? You are sexually active with a partner who has HIV. Talk with your health care provider about whether you are at high risk of being infected with HIV. If you choose to begin PrEP, you should first be tested for HIV. You should then be tested every 3 months for as long as you are taking PrEP. Pregnancy  If you are premenopausal and you may become pregnant, ask your health care provider about preconception counseling.  If you may become pregnant, take 400 to 800 micrograms (mcg) of folic acid every day.  If you want to prevent pregnancy, talk to your health care provider about birth control (contraception). Osteoporosis and menopause  Osteoporosis is a disease in which the bones lose minerals and strength with aging. This can result in serious bone  fractures. Your risk for osteoporosis can be identified using a bone density scan.  If you are 50 years of age or older,  or if you are at risk for osteoporosis and fractures, ask your health care provider if you should be screened.  Ask your health care provider whether you should take a calcium or vitamin D supplement to lower your risk for osteoporosis.  Menopause may have certain physical symptoms and risks.  Hormone replacement therapy may reduce some of these symptoms and risks. Talk to your health care provider about whether hormone replacement therapy is right for you. Follow these instructions at home:  Schedule regular health, dental, and eye exams.  Stay current with your immunizations.  Do not use any tobacco products including cigarettes, chewing tobacco, or electronic cigarettes.  If you are pregnant, do not drink alcohol.  If you are breastfeeding, limit how much and how often you drink alcohol.  Limit alcohol intake to no more than 1 drink per day for nonpregnant women. One drink equals 12 ounces of beer, 5 ounces of wine, or 1 ounces of hard liquor.  Do not use street drugs.  Do not share needles.  Ask your health care provider for help if you need support or information about quitting drugs.  Tell your health care provider if you often feel depressed.  Tell your health care provider if you have ever been abused or do not feel safe at home. This information is not intended to replace advice given to you by your health care provider. Make sure you discuss any questions you have with your health care provider. Document Released: 07/16/2010 Document Revised: 06/08/2015 Document Reviewed: 10/04/2014 Elsevier Interactive Patient Education  2019 [employer]vier Inc.      Agustina Caroli, MD [facility]

## 2018-04-07 MED FILL — DULOXETINE HCL 60 MG CPEP: 60 | 90 days supply | Qty: 90 | Fill #0

## 2018-04-22 ENCOUNTER — Telehealth: Payer: Self-pay | Admitting: *Deleted

## 2018-04-22 NOTE — Telephone Encounter (Signed)
Faxed completed and signed documents to [employer]. Confirmation page received at 5:10 pm.

## 2018-04-24 ENCOUNTER — Emergency Department (HOSPITAL_COMMUNITY): Payer: PRIVATE HEALTH INSURANCE

## 2018-04-24 ENCOUNTER — Other Ambulatory Visit: Payer: Self-pay

## 2018-04-24 ENCOUNTER — Emergency Department (HOSPITAL_COMMUNITY)
Admission: EM | Admit: 2018-04-24 | Discharge: 2018-04-24 | Disposition: A | Discharge status: Home / Self Care | Payer: PRIVATE HEALTH INSURANCE | Attending: Emergency Medicine | Admitting: Emergency Medicine

## 2018-04-24 DIAGNOSIS — W19XXXA Unspecified fall, initial encounter: Secondary | ICD-10-CM

## 2018-04-24 DIAGNOSIS — M25551 Pain in right hip: Secondary | ICD-10-CM | POA: Diagnosis not present

## 2018-04-24 DIAGNOSIS — M7918 Myalgia, other site: Secondary | ICD-10-CM

## 2018-04-24 DIAGNOSIS — W01198A Fall on same level from slipping, tripping and stumbling with subsequent striking against other object, initial encounter: Secondary | ICD-10-CM | POA: Insufficient documentation

## 2018-04-24 DIAGNOSIS — M25521 Pain in right elbow: Secondary | ICD-10-CM | POA: Diagnosis not present

## 2018-04-24 DIAGNOSIS — Y93F9 Activity, other caregiving: Secondary | ICD-10-CM | POA: Insufficient documentation

## 2018-04-24 DIAGNOSIS — M25511 Pain in right shoulder: Secondary | ICD-10-CM | POA: Diagnosis present

## 2018-04-24 DIAGNOSIS — Y92230 Patient room in hospital as the place of occurrence of the external cause: Secondary | ICD-10-CM | POA: Insufficient documentation

## 2018-04-24 DIAGNOSIS — Y990 Civilian activity done for income or pay: Secondary | ICD-10-CM | POA: Insufficient documentation

## 2018-04-24 DIAGNOSIS — Z79899 Other long term (current) drug therapy: Secondary | ICD-10-CM | POA: Insufficient documentation

## 2018-04-24 MED ORDER — CYCLOBENZAPRINE HCL 10 MG PO TABS
5.0000 mg | ORAL_TABLET | Freq: Once | ORAL | Status: AC
  Administered 2018-04-24: 5 mg via ORAL
  Filled 2018-04-24: qty 1

## 2018-04-24 NOTE — Discharge Instructions (Addendum)

## 2018-04-24 NOTE — ED Notes (Signed)
Patient verbalizes understanding of discharge instructions. Opportunity for questioning and answers were provided. Armband removed by staff, pt discharged from ED.  

## 2018-04-24 NOTE — ED Provider Notes (Signed)
[facility] EMERGENCY DEPARTMENT Provider Note   CSN: [id number] Arrival date & time: 04/24/18  1743    History   Chief Complaint Chief Complaint  Patient presents with   Fall   Hip Pain   Shoulder Pain    HPI Lacey [name] is a 63 y.o. female.     HPI   Patient is a 64 year old female with a history of anxiety, arthritis, GERD, rotator cuff tear, who presents emergency department today for evaluation after a fall.  Patient is an employee at [employer].  She was attempting to reposition a patient.  She was holding onto her she and the patient was holding onto the other end of the sheet.  The patient let go that she and she fell backwards onto her right side.  States that the main areas that are hurting her are her right elbow and her right hip.  She did have some minimal pain behind the right knee and had minimal pain to the bilateral shoulders as well however does not feel like the pain is severe. Has chronic shoulder pain.  No head trauma or LOC.  Has chronic neck pain that is not worse since the fall.  No back pain.  No other injuries.  Has been ambulatory since the fall.  Not on blood thinners.  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    Past Surgical History:  Procedure Laterality Date   ANTERIOR AND POSTERIOR REPAIR  2003   with the TAH   BICEPT TENODESIS  11/05/2011   Procedure: BICEPS TENODESIS;  Surgeon: Hessie Dibble, MD;  Location: [facility];  Service: Orthopedics;  Laterality: Right;   TENODESIS  AND  ACRMIOCLAVICULAR RESECTION    ERCP  2012   ESOPHAGOGASTRODUODENOSCOPY (EGD) WITH PROPOFOL N/A 11/13/2017   Procedure: ESOPHAGOGASTRODUODENOSCOPY (EGD) WITH PROPOFOL;  Surgeon: Clarene Essex, MD;  Location: [facility];  Service: Endoscopy;  Laterality: N/A;   FOREIGN BODY REMOVAL  11/13/2017   Procedure: FOREIGN BODY REMOVAL;  Surgeon: Clarene Essex, MD;  Location: [facility] ENDOSCOPY;  Service: Endoscopy;;   HARDWARE REMOVAL  2010   left ankle   JOINT REPLACEMENT Right 2015   KNEE ARTHROSCOPY     right and left   ORIF ANKLE FRACTURE  2009   left   SHOULDER ARTHROSCOPY WITH ROTATOR CUFF REPAIR AND OPEN BICEPS TENODESIS Left 11/18/2014   Procedure: SHOULDER ARTHROSCOPY WITH ROTATOR CUFF REPAIR AND OPEN BICEPS TENODESIS;  Surgeon: Melrose Nakayama, MD;  Location: [place];  Service: Orthopedics;  Laterality: Left;   SHOULDER OPEN ROTATOR CUFF REPAIR  11/05/2011   Procedure: ROTATOR CUFF REPAIR SHOULDER OPEN;  Surgeon: Hessie Dibble, MD;  Location: [facility];  Service: Orthopedics;  Laterality: Right;  RIGHT SHOULDER OPEN ROTATOR CUFF REPAIR   TONSILLECTOMY     TOTAL ABDOMINAL HYSTERECTOMY  2003   rt ovarian mass.  LEFT ovary remaining.   TOTAL KNEE ARTHROPLASTY Right 11/09/2013   Procedure: TOTAL KNEE ARTHROPLASTY;  Surgeon: Hessie Dibble, MD;  Location: [place];  Service: Orthopedics;  Laterality: Right;   TOTAL KNEE ARTHROPLASTY Left 01/02/2016   Procedure: TOTAL KNEE ARTHROPLASTY;  Surgeon: Melrose Nakayama, MD;  Location: [place];  Service: Orthopedics;  Laterality: Left;   UPPER GASTROINTESTINAL ENDOSCOPY       OB History   No obstetric history on file.      Home Medications    Prior to Admission medications   Medication Sig Start Date End Date Taking? Authorizing Provider  albuterol (PROVENTIL HFA;VENTOLIN HFA) 108 (90 Base) MCG/ACT inhaler Inhale 2 puffs into the lungs every 6 (six) hours as needed for wheezing or shortness of breath. Patient not taking: Reported on 04/06/2018 02/17/18   Raylene Everts, MD  ALPRAZolam Duanne Moron) 0.5 MG tablet Take 1  tablet (0.5 mg total) by mouth at bedtime as needed for anxiety. 12/01/17   Rutherford Guys, MD  B Complex-Folic Acid (B COMPLEX-VITAMIN B12 PO) Take 1 tablet by mouth daily.    [provider]  cycloSPORINE (RESTASIS) 0.05 % ophthalmic emulsion Place 1 drop into both eyes daily.     [provider]  diphenhydrAMINE (BENADRYL) 25 MG tablet Take 50 mg by mouth at bedtime as needed for sleep.     [provider]  DULoxetine (CYMBALTA) 60 MG capsule Take 1 capsule (60 mg total) by mouth daily. 04/06/18 07/05/18  Horald Pollen, MD  ibuprofen (ADVIL,MOTRIN) 200 MG tablet Take 200-400 mg by mouth 2 (two) times daily as needed (for pain.).    [provider]  mupirocin ointment (BACTROBAN) 2 % Place 1 application into the nose daily.    [provider]  vitamin E 1000 UNIT capsule Take 1,000 Units by mouth daily.    [provider]    Family History Family History  Problem Relation Age of Onset   Depression Mother    Hypertension Mother    Diabetes Father     Social History Social History   Tobacco Use   Smoking status: Never Smoker   Smokeless tobacco: Never Used  Substance Use Topics   Alcohol use: Yes    Alcohol/week: 1.0 standard drinks    Types: 1 Shots of liquor per week    Comment: wine daily   Drug use: No     Allergies   Morphine and related   Review of Systems Review of Systems  Respiratory: Negative for shortness of breath.   Cardiovascular: Negative for chest pain.  Gastrointestinal: Negative for abdominal pain.  Musculoskeletal:       Right elbow pain, right hip pain, bilateral shoulder pain.  Chronic neck pain.  No back pain.  Neurological: Negative for headaches.       No head trauma or loc     Physical Exam Updated Vital Signs BP 125/85    Pulse 80    Temp 98.3 F (36.8 C) (Oral)    Resp 14    SpO2 100%   Physical Exam Vitals signs and nursing note reviewed.  Constitutional:       General: She is not in acute distress.    Appearance: She is well-developed.  HENT:     Head: Normocephalic and atraumatic.  Eyes:     Conjunctiva/sclera: Conjunctivae normal.  Neck:     Musculoskeletal: Neck supple.  Cardiovascular:     Rate and Rhythm: Normal rate.  Pulmonary:     Effort: Pulmonary effort is normal.  Musculoskeletal: Normal range of motion.     Comments:  No midline cervical, thoracic or lumbar tenderness.  Mild tenderness The lateral epicondyle on the right side.  There is small area of ecchymosis there.  No  deformity.  Full range of motion of the right elbow.  Minimal tenderness to the posterior aspect of the right shoulder without deformity.  Minimal tenderness to the anterior aspect of the left shoulder without deformity.  Tenderness located to the right inferior attic.  Mild tenderness of the posterior aspect of the right thigh.  No anterior right knee tenderness.  Ambulatory with steady gait without limp.  Strength intact to BUE and BLE  Skin:    General: Skin is warm and dry.  Neurological:     Mental Status: She is alert.      ED Treatments / Results  Labs (all labs ordered are listed, but only abnormal results are displayed) Labs Reviewed - No data to display  EKG None  Radiology Dg Elbow Complete Right  Result Date: 04/24/2018 CLINICAL DATA:  Fall, pain. EXAM: RIGHT ELBOW - COMPLETE 3+ VIEW COMPARISON:  None. FINDINGS: There is no evidence of fracture, dislocation, or joint effusion. There is no evidence of arthropathy or other focal bone abnormality. Soft tissues are unremarkable. IMPRESSION: Negative. Electronically Signed   By: Franki Cabot M.D.   On: 04/24/2018 19:36   Dg Hip Unilat W Or Wo Pelvis 2-3 Views Right  Result Date: 04/24/2018 CLINICAL DATA:  Fall, pain. EXAM: DG HIP (WITH OR WITHOUT PELVIS) 2-3V RIGHT COMPARISON:  None. FINDINGS: Single view of the pelvis and two views of the RIGHT hip are provided. Osseous alignment is normal. No  fracture line or displaced fracture fragment seen. No degenerative change. Soft tissues about the pelvis and RIGHT hip are unremarkable. IMPRESSION: Negative. Electronically Signed   By: Franki Cabot M.D.   On: 04/24/2018 19:35    Procedures Procedures (including critical care time)  Medications Ordered in ED Medications  cyclobenzaprine (FLEXERIL) tablet 5 mg (5 mg Oral Given 04/24/18 1936)     Initial Impression / Assessment and Plan / ED Course  I have reviewed the triage vital signs and the nursing notes.  Pertinent labs & imaging results that were available during my care of the patient were reviewed by me and considered in my medical decision making (see chart for details).   Final Clinical Impressions(s) / ED Diagnoses   Final diagnoses:  Fall, initial encounter  Right elbow pain  Right buttock pain   Patient presenting after fall from standing that occurred prior to arrival.  Mainly complaining of right elbow and right buttock pain.  She has some tenderness on exam.  No gross deformity.  She is moving all extremities without any difficulty and is ambulatory with steady gait.  No head trauma or LOC.  No midline tenderness.  X-ray of the right elbow is negative for acute fracture or bony abnormality.  X-ray of the pelvis was obtained which did not show any acute bony abnormality bleeding either.  I think that she may have some muscle tenderness leading to her symptoms.  I advised Tylenol, Motrin, and/or her home muscle relaxers for her symptoms.  Advised her to return to the ER for new or worsening symptoms.  She will says understanding the plan and reasons to return to the ED.  All questions answered.  Patient stable at discharge.  ED Discharge Orders    None       Bishop Dublin 04/25/18 0024    Quintella Reichert, MD 04/25/18 1119

## 2018-04-24 NOTE — ED Triage Notes (Signed)
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 Pt fell on her right elbow, hip, and shoulder. Pt able to ambulate but endorses difficulty due to the pain. Pt has limited ROM to left shoulder, full ROM to right arm.

## 2018-06-15 DIAGNOSIS — M25512 Pain in left shoulder: Secondary | ICD-10-CM | POA: Diagnosis not present

## 2018-06-17 ENCOUNTER — Other Ambulatory Visit: Payer: Self-pay | Admitting: Orthopaedic Surgery

## 2018-06-17 ENCOUNTER — Ambulatory Visit (HOSPITAL_COMMUNITY)
Admission: RE | Admit: 2018-06-17 | Discharge: 2018-06-17 | Disposition: A | Discharge status: Home / Self Care | Payer: 59 | Source: Ambulatory Visit | Attending: Orthopaedic Surgery | Admitting: Orthopaedic Surgery

## 2018-06-17 ENCOUNTER — Other Ambulatory Visit (HOSPITAL_COMMUNITY): Payer: Self-pay | Admitting: Orthopaedic Surgery

## 2018-06-17 ENCOUNTER — Other Ambulatory Visit: Payer: Self-pay

## 2018-06-17 DIAGNOSIS — M25512 Pain in left shoulder: Secondary | ICD-10-CM | POA: Diagnosis not present

## 2018-06-17 DIAGNOSIS — S4992XA Unspecified injury of left shoulder and upper arm, initial encounter: Secondary | ICD-10-CM | POA: Diagnosis not present

## 2018-06-24 ENCOUNTER — Ambulatory Visit (HOSPITAL_COMMUNITY): Admission: RE | Admit: 2018-06-24 | Payer: 59 | Source: Ambulatory Visit

## 2018-06-29 DIAGNOSIS — M75122 Complete rotator cuff tear or rupture of left shoulder, not specified as traumatic: Secondary | ICD-10-CM | POA: Diagnosis not present

## 2018-06-30 DIAGNOSIS — S46012A Strain of muscle(s) and tendon(s) of the rotator cuff of left shoulder, initial encounter: Secondary | ICD-10-CM | POA: Diagnosis not present

## 2018-06-30 DIAGNOSIS — G8918 Other acute postprocedural pain: Secondary | ICD-10-CM | POA: Diagnosis not present

## 2018-06-30 DIAGNOSIS — S43432A Superior glenoid labrum lesion of left shoulder, initial encounter: Secondary | ICD-10-CM | POA: Diagnosis not present

## 2018-06-30 DIAGNOSIS — M94212 Chondromalacia, left shoulder: Secondary | ICD-10-CM | POA: Diagnosis not present

## 2018-06-30 MED FILL — OXYCODONE-ACETAMINOPHEN 5-3: 5-325 | 3 days supply | Qty: 30 | Fill #0

## 2018-06-30 MED FILL — TIZANIDINE HCL 2 MG CAPS: 2 | 10 days supply | Qty: 30 | Fill #0

## 2018-07-06 DIAGNOSIS — Z4789 Encounter for other orthopedic aftercare: Secondary | ICD-10-CM | POA: Diagnosis not present

## 2018-07-06 DIAGNOSIS — M25512 Pain in left shoulder: Secondary | ICD-10-CM | POA: Diagnosis not present

## 2018-07-06 DIAGNOSIS — M75102 Unspecified rotator cuff tear or rupture of left shoulder, not specified as traumatic: Secondary | ICD-10-CM | POA: Diagnosis not present

## 2018-07-16 DIAGNOSIS — Z4789 Encounter for other orthopedic aftercare: Secondary | ICD-10-CM | POA: Diagnosis not present

## 2018-07-16 DIAGNOSIS — M25512 Pain in left shoulder: Secondary | ICD-10-CM | POA: Diagnosis not present

## 2018-07-16 DIAGNOSIS — M75102 Unspecified rotator cuff tear or rupture of left shoulder, not specified as traumatic: Secondary | ICD-10-CM | POA: Diagnosis not present

## 2018-07-20 DIAGNOSIS — Z4789 Encounter for other orthopedic aftercare: Secondary | ICD-10-CM | POA: Diagnosis not present

## 2018-07-20 DIAGNOSIS — M75102 Unspecified rotator cuff tear or rupture of left shoulder, not specified as traumatic: Secondary | ICD-10-CM | POA: Diagnosis not present

## 2018-07-20 DIAGNOSIS — M25512 Pain in left shoulder: Secondary | ICD-10-CM | POA: Diagnosis not present

## 2018-07-27 DIAGNOSIS — M25512 Pain in left shoulder: Secondary | ICD-10-CM | POA: Diagnosis not present

## 2018-07-27 DIAGNOSIS — M75102 Unspecified rotator cuff tear or rupture of left shoulder, not specified as traumatic: Secondary | ICD-10-CM | POA: Diagnosis not present

## 2018-07-27 DIAGNOSIS — Z4789 Encounter for other orthopedic aftercare: Secondary | ICD-10-CM | POA: Diagnosis not present

## 2018-07-29 DIAGNOSIS — Z4789 Encounter for other orthopedic aftercare: Secondary | ICD-10-CM | POA: Diagnosis not present

## 2018-07-29 DIAGNOSIS — M25512 Pain in left shoulder: Secondary | ICD-10-CM | POA: Diagnosis not present

## 2018-07-29 DIAGNOSIS — M75102 Unspecified rotator cuff tear or rupture of left shoulder, not specified as traumatic: Secondary | ICD-10-CM | POA: Diagnosis not present

## 2018-08-10 MED FILL — DULOXETINE HCL 60 MG CPEP: 60 | 90 days supply | Qty: 90 | Fill #1

## 2018-10-05 DIAGNOSIS — M25512 Pain in left shoulder: Secondary | ICD-10-CM | POA: Diagnosis not present

## 2018-10-05 DIAGNOSIS — Z09 Encounter for follow-up examination after completed treatment for conditions other than malignant neoplasm: Secondary | ICD-10-CM | POA: Diagnosis not present

## 2019-01-01 MED FILL — DULOXETINE HCL 60 MG CPEP: 60 | 90 days supply | Qty: 90 | Fill #2

## 2019-07-12 ENCOUNTER — Other Ambulatory Visit: Payer: Self-pay | Admitting: Family Medicine

## 2019-07-12 DIAGNOSIS — F4323 Adjustment disorder with mixed anxiety and depressed mood: Secondary | ICD-10-CM

## 2019-07-12 MED ORDER — DULOXETINE HCL 60 MG PO CPEP: 60.0000 mg | ORAL_CAPSULE | Freq: Every day | ORAL | 0 refills | Status: AC

## 2019-07-12 NOTE — Telephone Encounter (Signed)
Copied from [place] [id number]. Topic: Quick Communication - Rx Refill/Question >> Jul 12, 2019 10:30 AM Leward Quan A wrote: Medication: DULoxetine (CYMBALTA) 60 MG capsule    Has the patient contacted their pharmacy? Yes.   (Agent: If no, request that the patient contact the pharmacy for the refill.) (Agent: If yes, when and what did the pharmacy advise?)  Preferred Pharmacy (with phone number or street name): [id number] - [place], [address] AT [place]  Phone:  [phone] Fax:  [phone]     Agent: Please be advised that RX refills may take up to 3 business days. We ask that you follow-up with your pharmacy.

## 2019-07-12 NOTE — Telephone Encounter (Signed)
Patient is requesting a refill of the following medications: Requested Prescriptions   Pending Prescriptions Disp Refills  . DULoxetine (CYMBALTA) 60 MG capsule 90 capsule 3    Sig: Take 1 capsule (60 mg total) by mouth daily.    Date of patient request: 07/12/2019 Last office visit: 04/06/2018 Date of last refill: 04/06/2018 Last refill amount: 90 3 refills  Follow up time period per chart: N/A

## 2019-07-12 NOTE — Telephone Encounter (Signed)
Requesting refill.  LOV 04/06/18. Last ordered by other than pcp was 04/06/18. TC and Left VM to return call and make appointment with pcp in order to continue refills. Routing to provider for further consideration.

## 2019-07-13 ENCOUNTER — Telehealth: Payer: Self-pay | Admitting: Family Medicine

## 2019-07-13 NOTE — Telephone Encounter (Signed)
Called pt LVM for her to call and schedule appt for further refills on her cymbalta any provider ok per her provider

## 2019-07-13 NOTE — Telephone Encounter (Signed)
Called pt LVM for her to call back and schedule appt with any provider for refills

## 2019-08-20 ENCOUNTER — Ambulatory Visit: Payer: Self-pay | Admitting: Family Medicine

## 2019-08-23 IMAGING — DX RIGHT ELBOW - COMPLETE 3+ VIEW
4 series · 4 of 4 positions shown · non-contrast
Comparison: None.

CLINICAL DATA: Fall, pain.

EXAM:
RIGHT ELBOW - COMPLETE 3+ VIEW

[elbow ap]
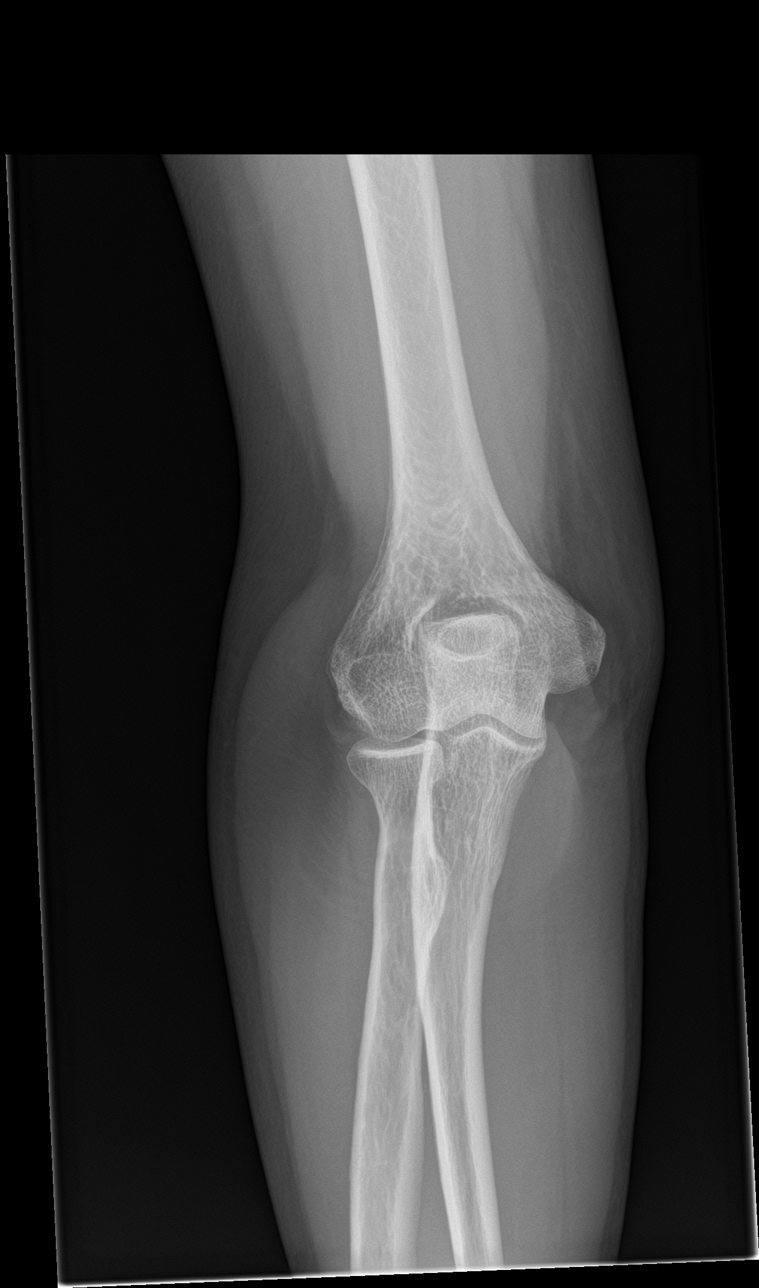

[elbow obl (1 of 2)]
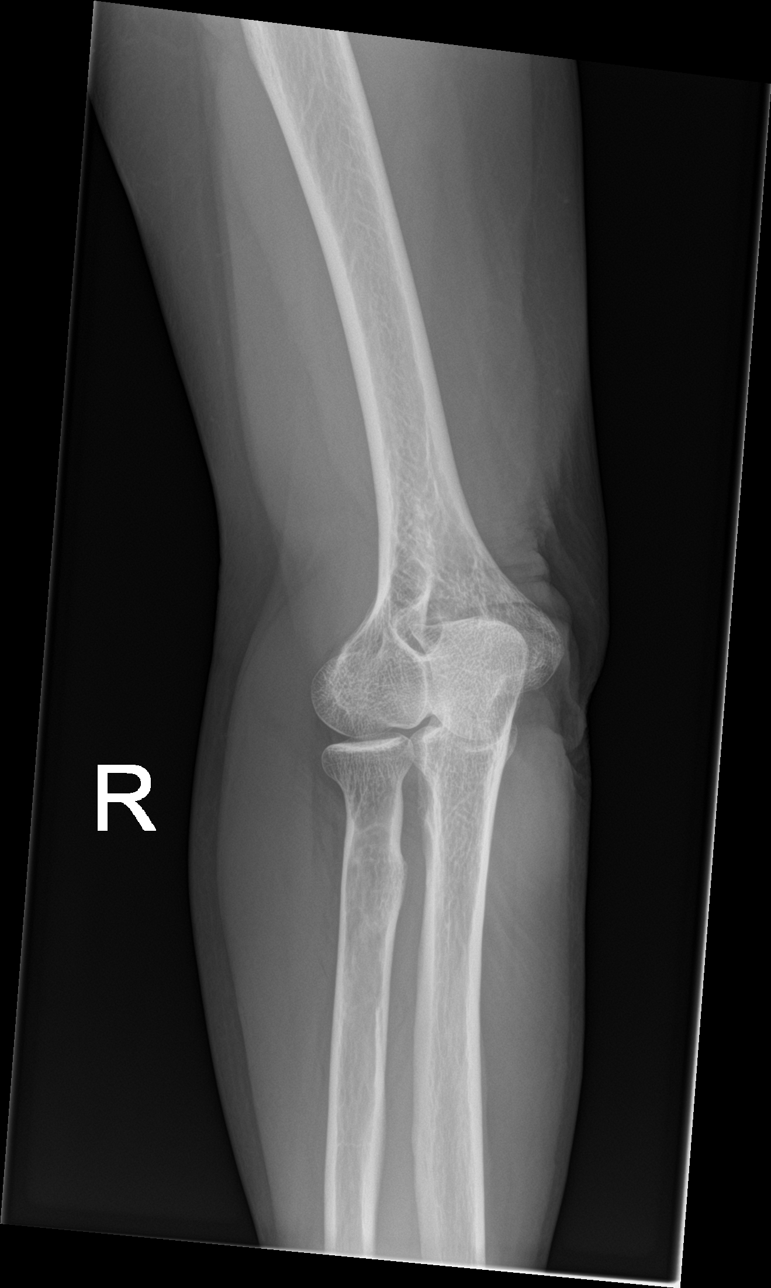

[elbow obl (2 of 2)]
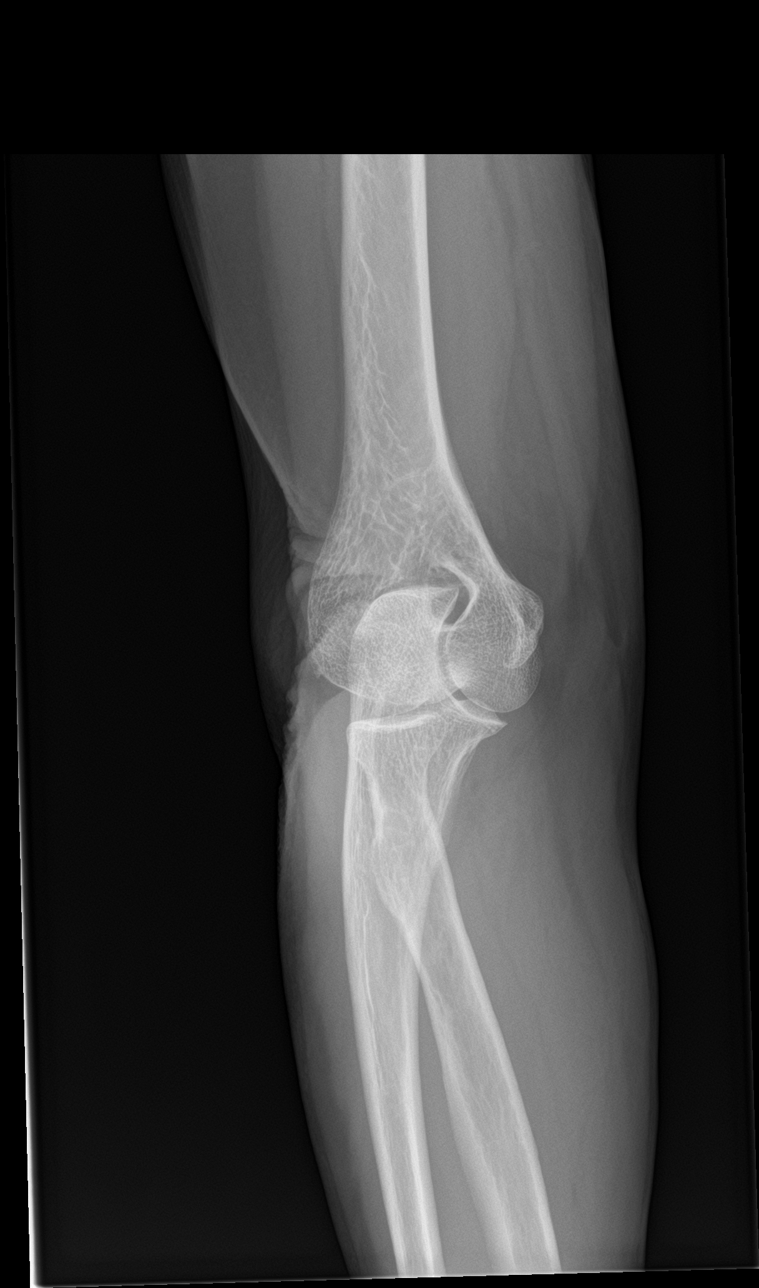

[elbow lat]
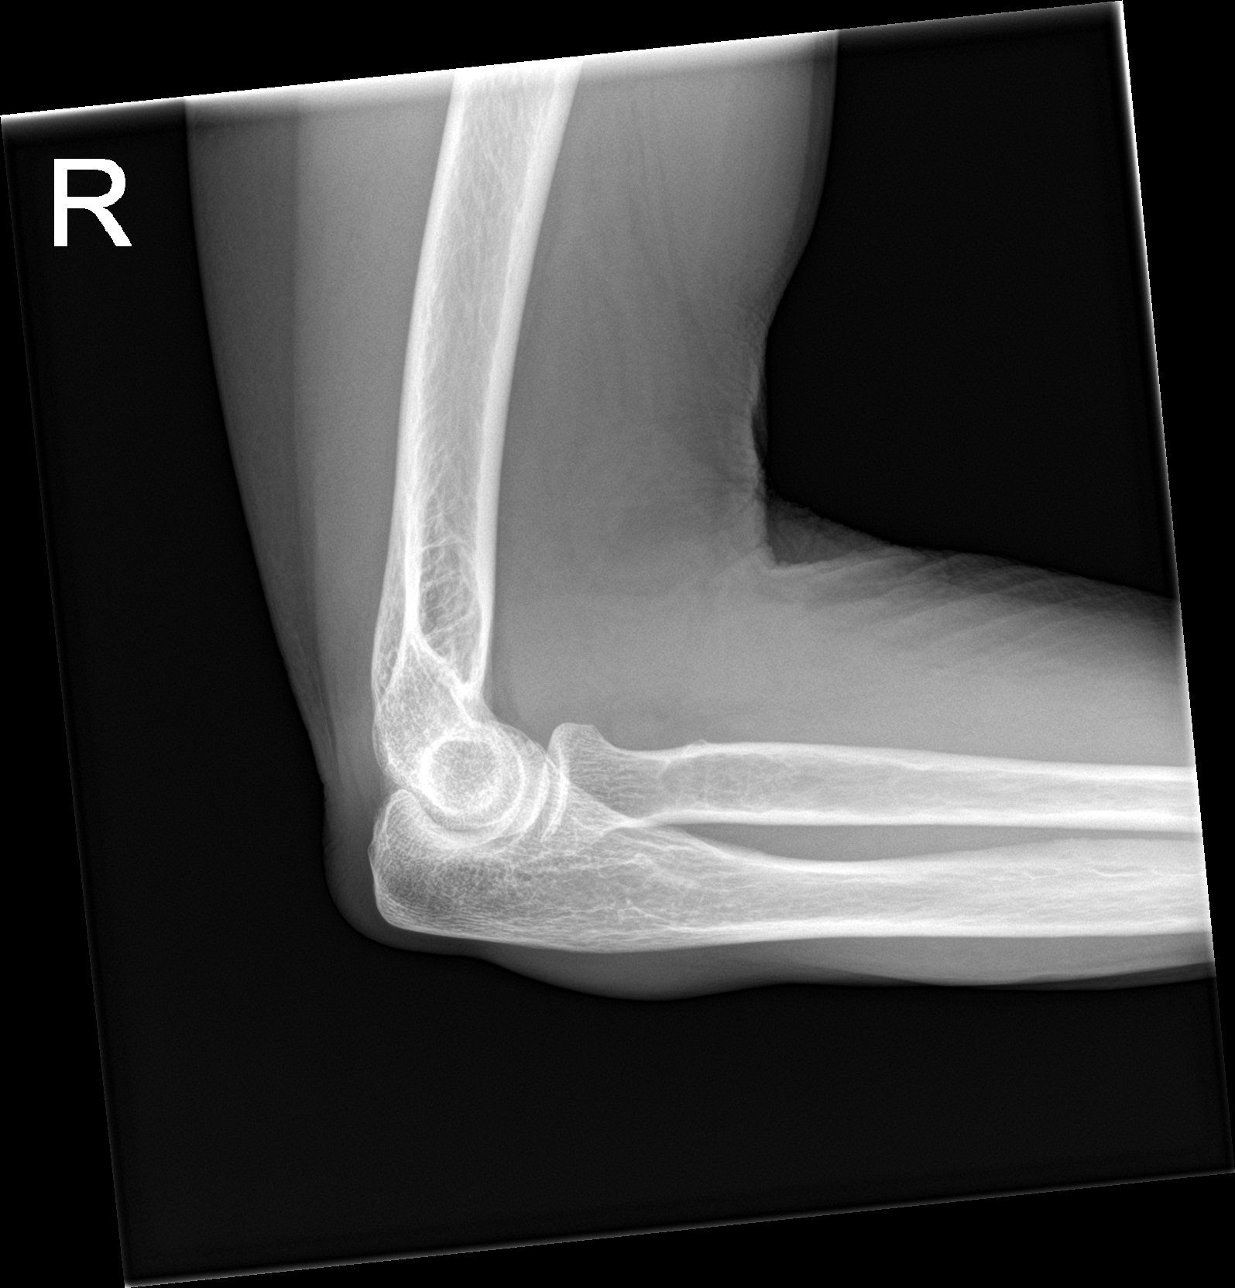

[4 of 4 positions shown; findings below may reference images not displayed]

FINDINGS: There is no evidence of fracture, dislocation, or joint effusion.
There is no evidence of arthropathy or other focal bone abnormality.
Soft tissues are unremarkable.
IMPRESSION: Negative.

## 2019-10-07 ENCOUNTER — Encounter: Payer: Self-pay | Admitting: *Deleted
